# Patient Record
Sex: Female | Born: 1985 | Race: White | Hispanic: No | Marital: Married | Smoking: Never smoker
Health system: Southern US, Community
[De-identification: ages and names within clinical notes are randomized; demographics above are authoritative.]

## PROBLEM LIST (undated history)

## (undated) ENCOUNTER — Inpatient Hospital Stay (HOSPITAL_COMMUNITY): Payer: Self-pay

## (undated) DIAGNOSIS — J45909 Unspecified asthma, uncomplicated: Secondary | ICD-10-CM

## (undated) DIAGNOSIS — K649 Unspecified hemorrhoids: Secondary | ICD-10-CM

## (undated) DIAGNOSIS — F411 Generalized anxiety disorder: Secondary | ICD-10-CM

## (undated) HISTORY — DX: Generalized anxiety disorder: F41.1

## (undated) HISTORY — PX: ADENOIDECTOMY: SUR15

## (undated) HISTORY — PX: TONSILLECTOMY: SUR1361

## (undated) HISTORY — PX: TYMPANOSTOMY TUBE PLACEMENT: SHX32

## (undated) HISTORY — PX: WISDOM TOOTH EXTRACTION: SHX21

## (undated) HISTORY — DX: Unspecified hemorrhoids: K64.9

---

## 2017-06-09 ENCOUNTER — Encounter: Payer: Self-pay | Admitting: Osteopathic Medicine

## 2017-06-09 ENCOUNTER — Ambulatory Visit (INDEPENDENT_AMBULATORY_CARE_PROVIDER_SITE_OTHER): Payer: Managed Care, Other (non HMO) | Admitting: Osteopathic Medicine

## 2017-06-09 ENCOUNTER — Encounter (INDEPENDENT_AMBULATORY_CARE_PROVIDER_SITE_OTHER): Payer: Self-pay

## 2017-06-09 VITALS — BP 106/71 | HR 79 | Temp 98.6°F | Ht 70.0 in | Wt 160.0 lb

## 2017-06-09 DIAGNOSIS — Z3041 Encounter for surveillance of contraceptive pills: Secondary | ICD-10-CM | POA: Diagnosis not present

## 2017-06-09 DIAGNOSIS — F411 Generalized anxiety disorder: Secondary | ICD-10-CM | POA: Diagnosis not present

## 2017-06-09 DIAGNOSIS — K649 Unspecified hemorrhoids: Secondary | ICD-10-CM | POA: Diagnosis not present

## 2017-06-09 DIAGNOSIS — Z8342 Family history of familial hypercholesterolemia: Secondary | ICD-10-CM | POA: Diagnosis not present

## 2017-06-09 DIAGNOSIS — Z23 Encounter for immunization: Secondary | ICD-10-CM

## 2017-06-09 DIAGNOSIS — H1032 Unspecified acute conjunctivitis, left eye: Secondary | ICD-10-CM | POA: Diagnosis not present

## 2017-06-09 HISTORY — DX: Unspecified hemorrhoids: K64.9

## 2017-06-09 HISTORY — DX: Generalized anxiety disorder: F41.1

## 2017-06-09 MED ORDER — HYDROCORTISONE 2.5 % RE CREA: 1.0000 "application " | TOPICAL_CREAM | Freq: Two times a day (BID) | RECTAL | 2 refills | Status: DC

## 2017-06-09 MED ORDER — SERTRALINE HCL 100 MG PO TABS: 100.0000 mg | ORAL_TABLET | Freq: Every day | ORAL | 3 refills | Status: AC

## 2017-06-09 MED ORDER — ERYTHROMYCIN 5 MG/GM OP OINT: TOPICAL_OINTMENT | OPHTHALMIC | 0 refills | Status: DC

## 2017-06-09 MED ORDER — BENZOCAINE 20 % RE OINT: TOPICAL_OINTMENT | RECTAL | 2 refills | Status: DC | PRN

## 2017-06-09 NOTE — Progress Notes (Signed)
HPI: Brenda Banks is a 32 y.o. female who  has no past medical history on file.  she [facility] to [facility]  today, 06/09/17,  for chief complaint of: New to Establish Eye Redness and Itching   Eye problem: . Context: no injury . Location: L eye conjunctiva, lids ok . Quality: itching, some drainage, no pain . Duration: 1 day . Timing: constant  Hx hemorrhoids: requests cream she was on before, can't recall name. Occasional pain with BM and some bleeding, not painful right now.   Available records reviewed, ER visit 03/24/17 - seen for anxiety attack, Rx Hydroxyzine  Hx Anxiety: currently on Zoloft 50 mg daily, would like to increase to 100. Previously Rx by her OBGYN. Doing okay on this medicine but feels like could be a bit better.   Contraception: OCP, thinking of another pregnancy in the future but not sure, happy with current method     Past medical, surgical, social and family history reviewed:  There are no active problems to display for this patient.  History reviewed. No pertinent surgical history.  Social History   Tobacco Use  . Smoking status: Not on file  Substance Use Topics  . Alcohol use: Not on file    Family History  Problem Relation Age of Onset  . Alcoholism Mother   . High Cholesterol Mother   . Alcoholism Maternal Grandmother   . High Cholesterol Maternal Grandmother   . Heart attack Maternal Grandfather   . High Cholesterol Maternal Grandfather   . Breast cancer Maternal Aunt   . High Cholesterol Maternal Aunt      Current medication list and allergy/intolerance information reviewed:    Current Outpatient Medications  Medication Sig Dispense Refill  .   28 g 2  .   3.5 g 0  .   28.35 g 2  . norethindrone (MICRONOR,CAMILA,ERRIN) 0.35 MG tablet     . sertraline (ZOLOFT) 50 MG tablet      No current facility-administered medications for this visit.     Allergies  Allergen Reactions  . Cefdinir      As per pt  . Sulfa Antibiotics       Review of Systems:  Constitutional:  No  fever, no chills, No recent illness, No unintentional weight changes. No significant fatigue.   HEENT: No  headache, no vision change, no hearing change, No sore throat, No  sinus pressure  Cardiac: No  chest pain, No  pressure, No palpitations, No  Orthopnea  Respiratory:  No  shortness of breath. No  Cough  Gastrointestinal: No  abdominal pain, No  nausea, No  vomiting,  No  blood in stool, No  diarrhea, No  constipation   Musculoskeletal: No new myalgia/arthralgia  Skin: No  Rash, No other wounds/concerning lesions  Genitourinary: No  incontinence, No  abnormal genital bleeding, No abnormal genital discharge  Hem/Onc: No  easy bruising/bleeding, No  abnormal lymph node  Endocrine: No cold intolerance,  No heat intolerance. No polyuria/polydipsia/polyphagia   Neurologic: No  weakness, No  dizziness, No  slurred speech/focal weakness/facial droop  Psychiatric: No  concerns with depression, +concerns with anxiety, No sleep problems, No mood problems  Exam:  BP 106/71   Pulse 79   Temp 98.6 F (37 C) (Oral)   Ht 5\' 10"  (1.778 m)   Wt 160 lb (72.6 kg)   BMI 22.96 kg/m    Constitutional: VS see above. General Appearance: alert, well-developed, well-nourished, NAD  Eyes:  Normal lids, non-icteric sclera, (+)injected conjunctiva on L, no drainage, normal eyelashes, EOMI, PERRL  Ears, Nose, Mouth, Throat: MMM, Normal external inspection ears/nares/mouth/lips/gums.  Neck: No masses, trachea midline. No thyroid enlargement. No tenderness/mass appreciated. No lymphadenopathy  Respiratory: Normal respiratory effort. no wheeze, no rhonchi, no rales  Cardiovascular: S1/S2 normal, no murmur, no rub/gallop auscultated. RRR.   Musculoskeletal: Gait normal.  Neurological: Normal balance/coordination. No tremor.   Skin: warm, dry, intact. No rash/ulcer. No concerning nevi or subq nodules on  limited exam.    Psychiatric: Normal judgment/insight. Normal mood and affect. Oriented x3.      ASSESSMENT/PLAN:   Acute conjunctivitis of left eye, unspecified acute conjunctivitis type - Plan: erythromycin ophthalmic ointment  Need for influenza vaccination - Plan: Flu Vaccine QUAD 6+ mos PF IM (Fluarix Quad PF)  Hemorrhoids, unspecified hemorrhoid type - Plan: benzocaine (AMERICAINE) 20 % rectal ointment, hydrocortisone (ANUSOL-HC) 2.5 % rectal cream  Family history of high cholesterol  Generalized anxiety disorder - Plan: sertraline (ZOLOFT) 100 MG tablet, DISCONTINUED: sertraline (ZOLOFT) 50 MG tablet  Encounter for surveillance of contraceptive pills - Plan: norethindrone (MICRONOR,CAMILA,ERRIN) 0.35 MG tablet      Visit summary with medication list and pertinent instructions was printed for patient to review. All questions at time of visit were answered - patient instructed to contact office with any additional concerns. ER/RTC precautions were reviewed with the patient.   Follow-up plan: Return for [facility], sooner if needed .   Please note: voice recognition software was used to produce this document, and typos may escape review. Please contact Dr. Lyn Hollingshead for any needed clarifications.

## 2017-06-18 ENCOUNTER — Telehealth: Payer: Self-pay | Admitting: Osteopathic Medicine

## 2017-06-18 MED ORDER — OSELTAMIVIR PHOSPHATE 75 MG PO CAPS: 75.0000 mg | ORAL_CAPSULE | Freq: Every day | ORAL | 0 refills | Status: DC

## 2017-06-18 NOTE — Telephone Encounter (Signed)
Husband tested positive for flu in office today, I'm sending her a prophylactic dose of Tamiflu

## 2017-08-10 LAB — HM PAP SMEAR: HM Pap smear: UNDETERMINED

## 2017-12-30 ENCOUNTER — Inpatient Hospital Stay (HOSPITAL_COMMUNITY)
Admission: AD | Admit: 2017-12-30 | Discharge: 2017-12-30 | Disposition: A | Discharge status: Home / Self Care | Payer: Managed Care, Other (non HMO) | Source: Ambulatory Visit | Attending: Obstetrics & Gynecology | Admitting: Obstetrics & Gynecology

## 2017-12-30 ENCOUNTER — Encounter (HOSPITAL_COMMUNITY): Payer: Self-pay | Admitting: *Deleted

## 2017-12-30 ENCOUNTER — Inpatient Hospital Stay (HOSPITAL_COMMUNITY): Payer: Managed Care, Other (non HMO)

## 2017-12-30 DIAGNOSIS — O039 Complete or unspecified spontaneous abortion without complication: Secondary | ICD-10-CM | POA: Insufficient documentation

## 2017-12-30 DIAGNOSIS — O209 Hemorrhage in early pregnancy, unspecified: Secondary | ICD-10-CM

## 2017-12-30 LAB — URINALYSIS, ROUTINE W REFLEX MICROSCOPIC
Bilirubin Urine: NEGATIVE
Glucose, UA: NEGATIVE mg/dL
Ketones, ur: NEGATIVE mg/dL
Nitrite: NEGATIVE
PROTEIN: 30 mg/dL — AB
Specific Gravity, Urine: 1.011 (ref 1.005–1.030)
pH: 6 (ref 5.0–8.0)

## 2017-12-30 LAB — HCG, QUANTITATIVE, PREGNANCY: hCG, Beta Chain, Quant, S: 80 m[IU]/mL — ABNORMAL HIGH (ref <5)

## 2017-12-30 LAB — CBC
HEMATOCRIT: 37 % (ref 36.0–46.0)
HEMOGLOBIN: 12.3 g/dL (ref 12.0–15.0)
MCH: 28.3 pg (ref 26.0–34.0)
MCHC: 33.2 g/dL (ref 30.0–36.0)
MCV: 85.1 fL (ref 78.0–100.0)
Platelets: 240 10*3/uL (ref 150–400)
RBC: 4.35 MIL/uL (ref 3.87–5.11)
RDW: 12.1 % (ref 11.5–15.5)
WBC: 7.7 10*3/uL (ref 4.0–10.5)

## 2017-12-30 LAB — WET PREP, GENITAL
CLUE CELLS WET PREP: NONE SEEN
SPERM: NONE SEEN
TRICH WET PREP: NONE SEEN
Yeast Wet Prep HPF POC: NONE SEEN

## 2017-12-30 LAB — ABO/RH: ABO/RH(D): B POS

## 2017-12-30 LAB — POCT PREGNANCY, URINE: Preg Test, Ur: POSITIVE — AB

## 2017-12-30 MED ORDER — ACETAMINOPHEN 500 MG PO TABS
1000.0000 mg | ORAL_TABLET | Freq: Once | ORAL | Status: AC
  Administered 2017-12-30: 1000 mg via ORAL
  Filled 2017-12-30: qty 2

## 2017-12-30 NOTE — MAU Provider Note (Signed)
Chief Complaint: Miscarriage   First Provider Initiated Contact with Patient 12/30/17 [phone]     SUBJECTIVE HPI: Brenda Banks is a 32 y.o. G3P1011 at [redacted]w[redacted]d who presents to [facility] reporting vaginal bleeding. Had positive HPT last week. Has appt with Dr. Charlotta Newton next Wednesday. Bleeding started last night as spotting. Increased this morning. Not saturating pads but has seen some stringy blood & small clots. Describes blood as purple. Denies abdominal pain, n/v/d, constipation, or recent intercourse.    Past Medical History:  Diagnosis Date  . Generalized anxiety disorder 06/09/2017  . Hemorrhoids 06/09/2017   OB History  Gravida Para Term Preterm AB Living  3 1 1  0 1 1  SAB TAB Ectopic Multiple Live Births  1 0 0 0 1    # Outcome Date GA Lbr Len/2nd Weight Sex Delivery Anes PTL Lv  3 Current           2 SAB           1 Term            Past Surgical History:  Procedure Laterality Date  . ADENOIDECTOMY    . TONSILLECTOMY    . TYMPANOSTOMY TUBE PLACEMENT     Social History   Socioeconomic History  . Marital status: Married    Spouse name: Hilda Rynders  . Number of children: 1  . Years of education: Not on file  . Highest education level: Some college, no degree  Occupational History  . Occupation: Psychologist, educational Asst     Employer: [employer]  Social Needs  . Financial resource strain: Not on file  . Food insecurity:    Worry: Not on file    Inability: Not on file  . Transportation needs:    Medical: Not on file    Non-medical: Not on file  Tobacco Use  . Smoking status: Never Smoker  . Smokeless tobacco: Never Used  Substance and Sexual Activity  . Alcohol use: Not Currently    Comment: not in pregnancy  . Drug use: No  . Sexual activity: Yes    Partners: Male    Birth control/protection: None  Lifestyle  . Physical activity:    Days per week: 0 days    Minutes per session: Not on file  . Stress: Not on file  Relationships  . Social connections:    Talks  on phone: Not on file    Gets together: Not on file    Attends religious service: Not on file    Active member of club or organization: Not on file    Attends meetings of clubs or organizations: Not on file    Relationship status: Not on file  . Intimate partner violence:    Fear of current or ex partner: Not on file    Emotionally abused: Not on file    Physically abused: Not on file    Forced sexual activity: Not on file  Other Topics Concern  . Not on file  Social History Narrative  . Not on file   Family History  Problem Relation Age of Onset  . Alcoholism Mother   . High Cholesterol Mother   . Alcoholism Maternal Grandmother   . High Cholesterol Maternal Grandmother   . Heart attack Maternal Grandfather   . High Cholesterol Maternal Grandfather   . Breast cancer Maternal Aunt   . High Cholesterol Maternal Aunt    No current facility-administered medications on file prior to encounter.    Current Outpatient Medications  on File Prior to Encounter  Medication Sig Dispense Refill  . benzocaine (AMERICAINE) 20 % rectal ointment Place rectally every 3 (three) hours as needed for pain (severe rectal pain). 28 g 2  . erythromycin ophthalmic ointment Apply 1/2 inch ribbon to affected eye QID for 5 days 3.5 g 0  . hydrocortisone (ANUSOL-HC) 2.5 % rectal cream Place 1 application rectally 2 (two) times daily. As needed for hemorrhoid pain, up to 1 week 28.35 g 2  . sertraline (ZOLOFT) 100 MG tablet Take 1 tablet (100 mg total) by mouth daily. 90 tablet 3   Allergies  Allergen Reactions  . Cefdinir     As per pt  . Sulfa Antibiotics     I have reviewed patient's Past Medical Hx, Surgical Hx, Family Hx, Social Hx, medications and allergies.   Review of Systems  Constitutional: Negative.   Gastrointestinal: Negative.   Genitourinary: Positive for vaginal bleeding.    OBJECTIVE Patient Vitals for the past 24 hrs:  BP Temp Temp src Pulse Resp Weight  12/30/17 1219 122/78 -  - 86 18 -  12/30/17 0932 127/82 98.3 F (36.8 C) Oral 75 20 78.2 kg   Constitutional: Well-developed, well-nourished female in no acute distress.  Cardiovascular: normal rate & rhythm, no murmur Respiratory: normal rate and effort. Lung sounds clear throughout GI: Abd soft, non-tender, Pos BS x 4. No guarding or rebound tenderness MS: Extremities nontender, no edema, normal ROM Neurologic: Alert and oriented x 4.  GU:     SPECULUM EXAM: NEFG, physiologic discharge, small amount of dark red mucoid  blood  BIMANUAL: No CMT. cervix closed; uterus normal size, no adnexal tenderness or  masses.    LAB RESULTS Results for orders placed or performed during the hospital encounter of 12/30/17 (from the past 24 hour(s))  Urinalysis, Routine w reflex microscopic     Status: Abnormal   Collection Time: 12/30/17  9:42 AM  Result Value Ref Range   Color, Urine YELLOW YELLOW   APPearance HAZY (A) CLEAR   Specific Gravity, Urine 1.011 1.005 - 1.030   pH 6.0 5.0 - 8.0   Glucose, UA NEGATIVE NEGATIVE mg/dL   Hgb urine dipstick LARGE (A) NEGATIVE   Bilirubin Urine NEGATIVE NEGATIVE   Ketones, ur NEGATIVE NEGATIVE mg/dL   Protein, ur 30 (A) NEGATIVE mg/dL   Nitrite NEGATIVE NEGATIVE   Leukocytes, UA SMALL (A) NEGATIVE   RBC / HPF >50 (H) 0 - 5 RBC/hpf   WBC, UA 21-50 0 - 5 WBC/hpf   Bacteria, UA RARE (A) NONE SEEN   Squamous Epithelial / LPF 0-5 0 - 5   Mucus PRESENT   Pregnancy, urine POC     Status: Abnormal   Collection Time: 12/30/17  9:45 AM  Result Value Ref Range   Preg Test, Ur POSITIVE (A) NEGATIVE  Wet prep, genital     Status: Abnormal   Collection Time: 12/30/17 10:24 AM  Result Value Ref Range   Yeast Wet Prep HPF POC NONE SEEN NONE SEEN   Trich, Wet Prep NONE SEEN NONE SEEN   Clue Cells Wet Prep HPF POC NONE SEEN NONE SEEN   WBC, Wet Prep HPF POC MODERATE (A) NONE SEEN   Sperm NONE SEEN   CBC     Status: None   Collection Time: 12/30/17 10:30 AM  Result Value Ref Range    WBC 7.7 4.0 - 10.5 K/uL   RBC 4.35 3.87 - 5.11 MIL/uL   Hemoglobin 12.3 12.0 - 15.0  g/dL   HCT 62.1 30.8 - 65.7 %   MCV 85.1 78.0 - 100.0 fL   MCH 28.3 26.0 - 34.0 pg   MCHC 33.2 30.0 - 36.0 g/dL   RDW 84.6 96.2 - 95.2 %   Platelets 240 150 - 400 K/uL  ABO/Rh     Status: None (Preliminary result)   Collection Time: 12/30/17 10:30 AM  Result Value Ref Range   ABO/RH(D)      B POS Performed at [facility], [address] [place], [place] [address]   hCG, quantitative, pregnancy     Status: Abnormal   Collection Time: 12/30/17 10:30 AM  Result Value Ref Range   hCG, Beta Chain, Quant, S 80 (H) <5 mIU/mL    IMAGING US Ob Less Than 14 Weeks With Ob Transvaginal  Result Date: 12/30/2017 CLINICAL DATA:  Vaginal bleeding EXAM: OBSTETRIC <14 WK [place] AND TRANSVAGINAL OB US TECHNIQUE: Both transabdominal and transvaginal ultrasound examinations were performed for complete evaluation of the gestation as well as the maternal uterus, adnexal regions, and pelvic cul-de-sac. Transvaginal technique was performed to assess early pregnancy. COMPARISON:  None. FINDINGS: Intrauterine gestational sac: None Yolk sac:  Not visualized Embryo:  Not visualized Cardiac Activity: Not visualized Heart Rate:   bpm MSD:   mm    w     d CRL:    mm    w    d                  [place] EDC: Subchorionic hemorrhage:  None visualized. Maternal uterus/adnexae: The uterus is retroverted. No adnexal mass. Trace free fluid. IMPRESSION: No intrauterine pregnancy visualized. Differential considerations would include early intrauterine pregnancy too early to visualize, spontaneous abortion, or occult ectopic pregnancy. Recommend close clinical followup and serial quantitative beta HCGs and ultrasounds. Electronically Signed   By: Charlett Nose M.D.   On: 12/30/2017 11:38    MAU COURSE Orders Placed This Encounter  Procedures  . Wet prep, genital  . US OB LESS THAN 14 WEEKS WITH OB TRANSVAGINAL  . Urinalysis, Routine w reflex  microscopic  . CBC  . hCG, quantitative, pregnancy  . Pregnancy, urine POC  . ABO/Rh  . Discharge patient   Meds ordered this encounter  Medications  . acetaminophen (TYLENOL) tablet 1,000 mg    MDM +UPT UA, wet prep, GC/chlamydia, CBC, ABO/Rh, quant hCG, and [place] today to rule out ectopic pregnancy B positive HCG 80 -- pt had positive HPT 9 days ago. Ultrasound shows no IUP or adnexal mass. Discussed results with Dr. Charlotta Newton. Appears like miscarriage d/t bleeding & low HCG. Pt to f/u in office in 2 weeks or sooner if she feels like she needs to be seen.    ASSESSMENT 1. Miscarriage   2. Vaginal bleeding in pregnancy, first trimester     PLAN Discharge home in stable condition. Bleeding precautions Work note given  Follow-up Information    Myna Hidalgo, DO Follow up.   Specialty:  Obstetrics and Gynecology Why:  Reschedule appointment for follow up in 2 weeks or sooner if needed Contact information: [address] [place] [address] [phone]          Allergies as of 12/30/2017      Reactions   Cefdinir    As per pt   Sulfa Antibiotics       Medication List    STOP taking these medications   norethindrone 0.35 MG tablet Commonly known as:  MICRONOR,CAMILA,ERRIN  oseltamivir 75 MG capsule Commonly known as:  TAMIFLU     TAKE these medications   benzocaine 20 % rectal ointment Commonly known as:  AMERICAINE Place rectally every 3 (three) hours as needed for pain (severe rectal pain).   erythromycin ophthalmic ointment Apply 1/2 inch ribbon to affected eye QID for 5 days   hydrocortisone 2.5 % rectal cream Commonly known as:  ANUSOL-HC Place 1 application rectally 2 (two) times daily. As needed for hemorrhoid pain, up to 1 week   sertraline 100 MG tablet Commonly known as:  ZOLOFT Take 1 tablet (100 mg total) by mouth daily.        Judeth HornLawrence, Amoreena Neubert, NP 12/30/2017  12:33 PM

## 2017-12-30 NOTE — Discharge Instructions (Signed)

## 2017-12-30 NOTE — MAU Note (Signed)
Patient reports she started having vaginal bleeding last night at 1800 and states it has gotten heavier this AM, passing several small clots.  Denies cramping pain, but states having a localized anterior headache above her right eye she rates a 6/10.  Hx of previous miscarriage.

## 2017-12-31 LAB — GC/CHLAMYDIA PROBE AMP (~~LOC~~) NOT AT ARMC
CHLAMYDIA, DNA PROBE: NEGATIVE
NEISSERIA GONORRHEA: NEGATIVE

## 2018-02-11 LAB — OB RESULTS CONSOLE ANTIBODY SCREEN: Antibody Screen: NEGATIVE

## 2018-02-11 LAB — OB RESULTS CONSOLE ABO/RH: RH Type: POSITIVE

## 2018-02-11 LAB — OB RESULTS CONSOLE RPR: RPR: NONREACTIVE

## 2018-02-11 LAB — OB RESULTS CONSOLE HIV ANTIBODY (ROUTINE TESTING): HIV: NONREACTIVE

## 2018-02-11 LAB — OB RESULTS CONSOLE RUBELLA ANTIBODY, IGM: Rubella: IMMUNE

## 2018-02-11 LAB — OB RESULTS CONSOLE GC/CHLAMYDIA
Chlamydia: NEGATIVE
Gonorrhea: NEGATIVE

## 2018-02-11 LAB — OB RESULTS CONSOLE HEPATITIS B SURFACE ANTIGEN: Hepatitis B Surface Ag: NEGATIVE

## 2018-02-20 ENCOUNTER — Emergency Department (INDEPENDENT_AMBULATORY_CARE_PROVIDER_SITE_OTHER)
Admission: EM | Admit: 2018-02-20 | Discharge: 2018-02-20 | Disposition: A | Discharge status: Home / Self Care | Payer: Managed Care, Other (non HMO) | Source: Home / Self Care | Attending: Emergency Medicine | Admitting: Emergency Medicine

## 2018-02-20 ENCOUNTER — Other Ambulatory Visit: Payer: Self-pay

## 2018-02-20 DIAGNOSIS — S92501A Displaced unspecified fracture of right lesser toe(s), initial encounter for closed fracture: Secondary | ICD-10-CM | POA: Diagnosis not present

## 2018-02-20 NOTE — ED Provider Notes (Signed)
[facility]    CSN: [id number] Arrival date & time: 02/20/18  1055     History   Chief Complaint Chief Complaint  Patient presents with  . Foot Injury    RT second toe    HPI Brenda Banks is a 32 y.o. female.   HPI Patient was at  Today and struck her right second toe on a piece of furniture. She is [redacted] weeks pregnant.toe is dislocated. Past Medical History:  Diagnosis Date  . Generalized anxiety disorder 06/09/2017  . Hemorrhoids 06/09/2017    Patient Active Problem List   Diagnosis Date Noted  . Generalized anxiety disorder 06/09/2017  . Hemorrhoids 06/09/2017  . Family history of high cholesterol 06/09/2017  . Acute conjunctivitis of left eye 06/09/2017    Past Surgical History:  Procedure Laterality Date  . ADENOIDECTOMY    . TONSILLECTOMY    . TYMPANOSTOMY TUBE PLACEMENT      OB History    Gravida  3   Para  1   Term  1   Preterm  0   AB  1   Living  1     SAB  1   TAB  0   Ectopic  0   Multiple  0   Live Births  1            Home Medications    Prior to Admission medications   Medication Sig Start Date End Date Taking? Authorizing Provider  benzocaine (AMERICAINE) 20 % rectal ointment Place rectally every 3 (three) hours as needed for pain (severe rectal pain). 06/09/17   Sunnie Nielsen, DO  erythromycin ophthalmic ointment Apply 1/2 inch ribbon to affected eye QID for 5 days 06/09/17   Sunnie Nielsen, DO  hydrocortisone (ANUSOL-HC) 2.5 % rectal cream Place 1 application rectally 2 (two) times daily. As needed for hemorrhoid pain, up to 1 week 06/09/17   Sunnie Nielsen, DO  sertraline (ZOLOFT) 100 MG tablet Take 1 tablet (100 mg total) by mouth daily. 06/09/17   Sunnie Nielsen, DO    Family History Family History  Problem Relation Age of Onset  . Alcoholism Mother   . High Cholesterol Mother   . Alcoholism Maternal Grandmother   . High Cholesterol Maternal Grandmother   . Heart attack Maternal  Grandfather   . High Cholesterol Maternal Grandfather   . Breast cancer Maternal Aunt   . High Cholesterol Maternal Aunt     Social History Social History   Tobacco Use  . Smoking status: Never Smoker  . Smokeless tobacco: Never Used  Substance Use Topics  . Alcohol use: Not Currently    Comment: not in pregnancy  . Drug use: No     Allergies   Cefdinir and Sulfa antibiotics   Review of Systems Review of Systems  Genitourinary:       Patient is [redacted] weeks pregnant     Physical Exam Triage Vital Signs ED Triage Vitals  Enc Vitals Group     BP 02/20/18 1220 123/72     Pulse Rate 02/20/18 1220 79     Resp --      Temp --      Temp src --      SpO2 02/20/18 1220 100 %     Weight --      Height --      Head Circumference --      Peak Flow --      Pain Score 02/20/18 1221 9  Pain Loc --      Pain Edu? --      Excl. in GC? --    No data found.  Updated Vital Signs BP 123/72 (BP Location: Right Arm)   Pulse 79   LMP 11/20/2017   SpO2 100%   Visual Acuity Right Eye Distance:   Left Eye Distance:   Bilateral Distance:    Right Eye Near:   Left Eye Near:    Bilateral Near:     Physical Exam  Musculoskeletal:  There is tenderness and swelling over th distal right great toe. The length of the toe is normal.she does have som flexion of the MTP, PIP, and DIP joints Capillary filling normal     UC Treatments / Results  Labs (all labs ordered are listed, but only abnormal results are displayed) Labs Reviewed - No data to display  EKG None  Radiology No results found.  Procedures Procedures (including critical care time)  Medications Ordered in UC Medications - No data to display  Initial Impression / Assessment and Plan / UC Course  I have reviewed the triage vital signs and the nursing notes.  Pertinent labs & imaging results that were available during my care of the patient were reviewed by me and considered in my medical decision making  (see chart for details).     No x-rays were done because the patient is [redacted] weeks pregnant. There were no evidence of a dislocation.I suspect she has a nndisplaced fracture. She will be treated with buddy taping rest and a postop shoe. Final Clinical Impressions(s) / UC Diagnoses   Final diagnoses:  Closed fracture of phalanx of right second toe, initial encounter     Discharge Instructions     Continue buddy taping. Wear a postop shoe    ED Prescriptions    None     Controlled Substance Prescriptions [place] Controlled Substance Registry consulted? Not Applicable   Collene Gobble, MD 02/20/18 [phone]

## 2018-02-20 NOTE — Discharge Instructions (Addendum)
Continue buddy taping. Wear a postop shoe

## 2018-02-20 NOTE — ED Triage Notes (Signed)
Pt stubbed toe at home. Painful to put pressure on it. Feels somewhat numb. Does not know if its broken or dislocated. Pain 9/10.

## 2018-05-12 NOTE — L&D Delivery Note (Signed)
Delivery Note At 2:22 PM a viable female was delivered via Vaginal, Spontaneous (Presentation: LOA).  APGAR: 6, 9; weight  pending Placenta status: L&D, .  Cord:  with the following complications: none.  Cord pH: n/a  Anesthesia:  epidural Episiotomy:  n/a Lacerations: 2nd degree Suture Repair: 2.0 3.0 vicryl Est. Blood Loss (mL): 242  Mom to postpartum.  Baby to Couplet care / Skin to Skin.  Sharon Seller 09/25/2018, 2:46 PM

## 2018-08-17 ENCOUNTER — Other Ambulatory Visit: Payer: Self-pay

## 2018-08-17 ENCOUNTER — Encounter (HOSPITAL_COMMUNITY): Payer: Self-pay | Admitting: *Deleted

## 2018-08-17 ENCOUNTER — Inpatient Hospital Stay (HOSPITAL_COMMUNITY)
Admission: AD | Admit: 2018-08-17 | Discharge: 2018-08-17 | Disposition: A | Discharge status: Home / Self Care | Payer: Managed Care, Other (non HMO) | Attending: Obstetrics & Gynecology | Admitting: Obstetrics & Gynecology

## 2018-08-17 DIAGNOSIS — O9989 Other specified diseases and conditions complicating pregnancy, childbirth and the puerperium: Secondary | ICD-10-CM | POA: Diagnosis not present

## 2018-08-17 DIAGNOSIS — O4703 False labor before 37 completed weeks of gestation, third trimester: Secondary | ICD-10-CM | POA: Insufficient documentation

## 2018-08-17 DIAGNOSIS — O26893 Other specified pregnancy related conditions, third trimester: Secondary | ICD-10-CM | POA: Insufficient documentation

## 2018-08-17 DIAGNOSIS — R197 Diarrhea, unspecified: Secondary | ICD-10-CM | POA: Insufficient documentation

## 2018-08-17 DIAGNOSIS — Z3A33 33 weeks gestation of pregnancy: Secondary | ICD-10-CM | POA: Diagnosis not present

## 2018-08-17 DIAGNOSIS — R1030 Lower abdominal pain, unspecified: Secondary | ICD-10-CM | POA: Insufficient documentation

## 2018-08-17 DIAGNOSIS — O99513 Diseases of the respiratory system complicating pregnancy, third trimester: Secondary | ICD-10-CM | POA: Diagnosis not present

## 2018-08-17 DIAGNOSIS — J45909 Unspecified asthma, uncomplicated: Secondary | ICD-10-CM | POA: Diagnosis not present

## 2018-08-17 DIAGNOSIS — Z79899 Other long term (current) drug therapy: Secondary | ICD-10-CM | POA: Diagnosis not present

## 2018-08-17 DIAGNOSIS — Z3689 Encounter for other specified antenatal screening: Secondary | ICD-10-CM | POA: Insufficient documentation

## 2018-08-17 DIAGNOSIS — M545 Low back pain: Secondary | ICD-10-CM | POA: Insufficient documentation

## 2018-08-17 HISTORY — DX: Unspecified asthma, uncomplicated: J45.909

## 2018-08-17 LAB — WET PREP, GENITAL
Clue Cells Wet Prep HPF POC: NONE SEEN
Sperm: NONE SEEN
Trich, Wet Prep: NONE SEEN
Yeast Wet Prep HPF POC: NONE SEEN

## 2018-08-17 LAB — URINALYSIS, ROUTINE W REFLEX MICROSCOPIC
Bilirubin Urine: NEGATIVE
Glucose, UA: NEGATIVE mg/dL
Hgb urine dipstick: NEGATIVE
Ketones, ur: NEGATIVE mg/dL
Nitrite: NEGATIVE
Protein, ur: NEGATIVE mg/dL
Specific Gravity, Urine: 1.004 — ABNORMAL LOW (ref 1.005–1.030)
pH: 7 (ref 5.0–8.0)

## 2018-08-17 LAB — FETAL FIBRONECTIN: Fetal Fibronectin: NEGATIVE

## 2018-08-17 MED ORDER — LACTATED RINGERS IV BOLUS: 1000.0000 mL | Freq: Once | INTRAVENOUS | Status: DC

## 2018-08-17 MED ORDER — LACTATED RINGERS BOLUS PEDS: 1000.0000 mL | Freq: Once | INTRAVENOUS | Status: DC

## 2018-08-17 MED ORDER — NIFEDIPINE 10 MG PO CAPS
10.0000 mg | ORAL_CAPSULE | ORAL | Status: DC | PRN
  Administered 2018-08-17 (×2): 10 mg via ORAL
  Filled 2018-08-17 (×2): qty 1

## 2018-08-17 NOTE — MAU Note (Signed)
Pt presents to MAU with complaints of lower abdominal cramping and lower back pain since last night. Denies any vaginal bleeding.

## 2018-08-17 NOTE — Discharge Instructions (Signed)
Braxton Hicks Contractions Contractions of the uterus can occur throughout pregnancy, but they are not always a sign that you are in labor. You may have practice contractions called Braxton Hicks contractions. These false labor contractions are sometimes confused with true labor. What are Braxton Hicks contractions? Braxton Hicks contractions are tightening movements that occur in the muscles of the uterus before labor. Unlike true labor contractions, these contractions do not result in opening (dilation) and thinning of the cervix. Toward the end of pregnancy (32-34 weeks), Braxton Hicks contractions can happen more often and may become stronger. These contractions are sometimes difficult to tell apart from true labor because they can be very uncomfortable. You should not feel embarrassed if you go to the hospital with false labor. Sometimes, the only way to tell if you are in true labor is for your health care provider to look for changes in the cervix. The health care provider will do a physical exam and may monitor your contractions. If you are not in true labor, the exam should show that your cervix is not dilating and your water has not broken. If there are no other health problems associated with your pregnancy, it is completely safe for you to be sent home with false labor. You may continue to have Braxton Hicks contractions until you go into true labor. How to tell the difference between true labor and false labor True labor  Contractions last 30-70 seconds.  Contractions become very regular.  Discomfort is usually felt in the top of the uterus, and it spreads to the lower abdomen and low back.  Contractions do not go away with walking.  Contractions usually become more intense and increase in frequency.  The cervix dilates and gets thinner. False labor  Contractions are usually shorter and not as strong as true labor contractions.  Contractions are usually irregular.  Contractions  are often felt in the front of the lower abdomen and in the groin.  Contractions may go away when you walk around or change positions while lying down.  Contractions get weaker and are shorter-lasting as time goes on.  The cervix usually does not dilate or become thin. Follow these instructions at home:   Take over-the-counter and prescription medicines only as told by your health care provider.  Keep up with your usual exercises and follow other instructions from your health care provider.  Eat and drink lightly if you think you are going into labor.  If Braxton Hicks contractions are making you uncomfortable: ? Change your position from lying down or resting to walking, or change from walking to resting. ? Sit and rest in a tub of warm water. ? Drink enough fluid to keep your urine pale yellow. Dehydration may cause these contractions. ? Do slow and deep breathing several times an hour.  Keep all follow-up prenatal visits as told by your health care provider. This is important. Contact a health care provider if:  You have a fever.  You have continuous pain in your abdomen. Get help right away if:  Your contractions become stronger, more regular, and closer together.  You have fluid leaking or gushing from your vagina.  You pass blood-tinged mucus (bloody show).  You have bleeding from your vagina.  You have low back pain that you never had before.  You feel your baby's head pushing down and causing pelvic pressure.  Your baby is not moving inside you as much as it used to. Summary  Contractions that occur before labor are   called Braxton Hicks contractions, false labor, or practice contractions.  Braxton Hicks contractions are usually shorter, weaker, farther apart, and less regular than true labor contractions. True labor contractions usually become progressively stronger and regular, and they become more frequent.  Manage discomfort from Braxton Hicks contractions  by changing position, resting in a warm bath, drinking plenty of water, or practicing deep breathing. This information is not intended to replace advice given to you by your health care provider. Make sure you discuss any questions you have with your health care provider. Document Released: 09/11/2016 Document Revised: 02/10/2017 Document Reviewed: 09/11/2016 Elsevier Interactive Patient Education  2019 Elsevier Inc.  

## 2018-08-17 NOTE — MAU Provider Note (Signed)
History     CSN: [id number]  Arrival date and time: 08/17/18 1313   None     Chief Complaint  Patient presents with  . Abdominal Pain   HPI   Brenda Banks is 33 y.o. [id number] female who presents for lower abdominal cramping, back pain, and diarrhea. Reports that the lower abdominal cramping started yesterday at 17:30 and feels similar to menstrual cramps and is located midline. Low back pain and diarrhea have been present for about 3-4 days. Has been trying to take warm baths for relief. Denies h/o preterm delivery. Needed to be induced with prior delivery. Reports she was closed in the office the last time she was checked about 1-2 weeks ago. Denies vaginal bleeding. Endorses significant amount of asymptomatic vaginal discharge.   OB History    Gravida  4   Para  1   Term  1   Preterm  0   AB  2   Living  1     SAB  2   TAB  0   Ectopic  0   Multiple  0   Live Births  1           Past Medical History:  Diagnosis Date  . Asthma   . Generalized anxiety disorder 06/09/2017  . Hemorrhoids 06/09/2017    Past Surgical History:  Procedure Laterality Date  . ADENOIDECTOMY    . TONSILLECTOMY    . TYMPANOSTOMY TUBE PLACEMENT      Family History  Problem Relation Age of Onset  . Alcoholism Mother   . High Cholesterol Mother   . Alcoholism Maternal Grandmother   . High Cholesterol Maternal Grandmother   . Heart attack Maternal Grandfather   . High Cholesterol Maternal Grandfather   . Breast cancer Maternal Aunt   . High Cholesterol Maternal Aunt     Social History   Tobacco Use  . Smoking status: Never Smoker  . Smokeless tobacco: Never Used  Substance Use Topics  . Alcohol use: Not Currently    Comment: not in pregnancy  . Drug use: No    Allergies:  Allergies  Allergen Reactions  . Cefdinir     As per pt  . Sulfa Antibiotics     Medications Prior to Admission  Medication Sig Dispense Refill Last Dose  . benzocaine (AMERICAINE) 20 %  rectal ointment Place rectally every 3 (three) hours as needed for pain (severe rectal pain). 28 g 2   . erythromycin ophthalmic ointment Apply 1/2 inch ribbon to affected eye QID for 5 days 3.5 g 0   . hydrocortisone (ANUSOL-HC) 2.5 % rectal cream Place 1 application rectally 2 (two) times daily. As needed for hemorrhoid pain, up to 1 week 28.35 g 2   . sertraline (ZOLOFT) 100 MG tablet Take 1 tablet (100 mg total) by mouth daily. 90 tablet 3     Review of Systems  Constitutional: Negative for chills and fever.  HENT: Negative for congestion and rhinorrhea.   Respiratory: Negative for cough and shortness of breath.   Cardiovascular: Negative for chest pain.  Gastrointestinal: Positive for abdominal pain and diarrhea. Negative for constipation, nausea and vomiting.  Genitourinary: Positive for vaginal discharge. Negative for dysuria, flank pain, hematuria, pelvic pain and vaginal bleeding.  Musculoskeletal: Positive for back pain.  Skin: Negative for rash.  Neurological: Negative for dizziness and headaches.   Physical Exam   Blood pressure 116/78, pulse (!) 119, temperature 98.5 F (36.9 C), resp. rate 16, height 5\' 6"  (  1.676 m), weight 89.8 kg, last menstrual period 12/26/2017, SpO2 100 %, unknown if currently breastfeeding.  Physical Exam  Constitutional: She is oriented to person, place, and time. She appears well-developed and well-nourished. No distress.  HENT:  Head: Normocephalic and atraumatic.  Eyes: Conjunctivae and EOM are normal.  Neck: Normal range of motion. Neck supple.  Cardiovascular: Normal rate, regular rhythm and normal heart sounds.  Respiratory: Effort normal and breath sounds normal. No respiratory distress.  GI: Soft. She exhibits no distension. There is no abdominal tenderness. There is no rebound and no guarding.  Gravid   Genitourinary:    Genitourinary Comments: No vaginal discharge noted on blind swab    Musculoskeletal: Normal range of motion.      Comments: No CVA tenderness   Neurological: She is alert and oriented to person, place, and time.  Skin: Skin is warm and dry. She is not diaphoretic.  Psychiatric: She has a normal mood and affect. Her behavior is normal.   Cervix:  0/thick/posterior   NST: 140 bpm, moderate variability, +acels, no decels   MAU Course  Procedures  MDM NST reactive. Initially with irritability. FFN, wet prep, GC/Chlamydia collected.   During observation, noted to have contractions on toco. Procardia 10 mg x2 given and 1L LR bolus given. Contractions stopped on toco and patient's symptoms resolved on repeat exam.   Assessment and Plan   1. [redacted] weeks gestation of pregnancy   2. NST (non-stress test) reactive   3. Preterm uterine contractions, antepartum, third trimester    Preterm uterine contractions without cervical change. Reassuring that FFN negative. Wet prep negative. Cultures pending. Contractions resolved with Procardia and IVF. Encouraged adequate hydration. Labor precautions reviewed.     De HollingsheadCatherine L Wallace 08/17/2018, 1:54 PM

## 2018-08-18 LAB — GC/CHLAMYDIA PROBE AMP (~~LOC~~) NOT AT ARMC
Chlamydia: NEGATIVE
Neisseria Gonorrhea: NEGATIVE

## 2018-08-31 ENCOUNTER — Encounter (HOSPITAL_COMMUNITY): Payer: Self-pay | Admitting: *Deleted

## 2018-08-31 ENCOUNTER — Telehealth (HOSPITAL_COMMUNITY): Payer: Self-pay | Admitting: *Deleted

## 2018-08-31 NOTE — Telephone Encounter (Signed)
Preadmission screen  

## 2018-09-07 LAB — OB RESULTS CONSOLE GBS: GBS: NEGATIVE

## 2018-09-08 ENCOUNTER — Inpatient Hospital Stay (HOSPITAL_COMMUNITY): Payer: Managed Care, Other (non HMO)

## 2018-09-13 ENCOUNTER — Inpatient Hospital Stay (HOSPITAL_COMMUNITY)
Admission: AD | Admit: 2018-09-13 | Discharge: 2018-09-13 | Disposition: A | Discharge status: Home / Self Care | Payer: Managed Care, Other (non HMO) | Attending: Obstetrics & Gynecology | Admitting: Obstetrics & Gynecology

## 2018-09-13 ENCOUNTER — Other Ambulatory Visit: Payer: Self-pay

## 2018-09-13 ENCOUNTER — Encounter (HOSPITAL_COMMUNITY): Payer: Self-pay

## 2018-09-13 ENCOUNTER — Telehealth (HOSPITAL_COMMUNITY): Payer: Self-pay | Admitting: *Deleted

## 2018-09-13 DIAGNOSIS — Z3A37 37 weeks gestation of pregnancy: Secondary | ICD-10-CM | POA: Insufficient documentation

## 2018-09-13 DIAGNOSIS — O471 False labor at or after 37 completed weeks of gestation: Secondary | ICD-10-CM | POA: Insufficient documentation

## 2018-09-13 DIAGNOSIS — O479 False labor, unspecified: Secondary | ICD-10-CM | POA: Diagnosis not present

## 2018-09-13 NOTE — MAU Provider Note (Signed)
None      S: Ms. Brenda Banks is a 33 y.o. [phone] at [redacted]w[redacted]d  who presents to MAU today complaining contractions That started today.. She denies vaginal bleeding. She denies LOF. She reports normal fetal movement.    O: BP 128/81   Pulse 92   Temp 98.7 F (37.1 C) (Oral)   Resp 18   Wt 93 kg   LMP 12/26/2017   SpO2 100%   BMI 33.10 kg/m  GENERAL: Well-developed, well-nourished female in no acute distress.  HEAD: Normocephalic, atraumatic.  CHEST: Normal effort of breathing, regular heart rate ABDOMEN: Soft, nontender, gravid  Cervical exam:  Dilation: 1.5 Effacement (%): 20 Cervical Position: Anterior Station: -3 Presentation: Vertex Exam by:: Ginnie Smart RN  Patient was offered pain medication and declined. She was kept for an additional hour and her cervix was unchanged.   Fetal Monitoring: Baseline: 150 bpm Variability: Moderate  Accelerations: 15x15 Decelerations: Done Contractions: Irregular pattern.    A: SIUP at [redacted]w[redacted]d  False labor  P: Discharge home in stable condition Return to MAU if symptoms worsen  Labor precautions Kick counts    Jahan Friedlander, Harolyn Rutherford, NP 09/13/2018 8:47 PM

## 2018-09-13 NOTE — MAU Note (Signed)
Pt having ctx for a day and now about 5 minutes apart. Rates pain 4-5/10. No LOF or bleeding.

## 2018-09-13 NOTE — Telephone Encounter (Signed)
Preadmission screen  

## 2018-09-13 NOTE — Discharge Instructions (Signed)
Braxton Hicks Contractions Contractions of the uterus can occur throughout pregnancy, but they are not always a sign that you are in labor. You may have practice contractions called Braxton Hicks contractions. These false labor contractions are sometimes confused with true labor. What are Braxton Hicks contractions? Braxton Hicks contractions are tightening movements that occur in the muscles of the uterus before labor. Unlike true labor contractions, these contractions do not result in opening (dilation) and thinning of the cervix. Toward the end of pregnancy (32-34 weeks), Braxton Hicks contractions can happen more often and may become stronger. These contractions are sometimes difficult to tell apart from true labor because they can be very uncomfortable. You should not feel embarrassed if you go to the hospital with false labor. Sometimes, the only way to tell if you are in true labor is for your health care provider to look for changes in the cervix. The health care provider will do a physical exam and may monitor your contractions. If you are not in true labor, the exam should show that your cervix is not dilating and your water has not broken. If there are no other health problems associated with your pregnancy, it is completely safe for you to be sent home with false labor. You may continue to have Braxton Hicks contractions until you go into true labor. How to tell the difference between true labor and false labor True labor  Contractions last 30-70 seconds.  Contractions become very regular.  Discomfort is usually felt in the top of the uterus, and it spreads to the lower abdomen and low back.  Contractions do not go away with walking.  Contractions usually become more intense and increase in frequency.  The cervix dilates and gets thinner. False labor  Contractions are usually shorter and not as strong as true labor contractions.  Contractions are usually irregular.  Contractions  are often felt in the front of the lower abdomen and in the groin.  Contractions may go away when you walk around or change positions while lying down.  Contractions get weaker and are shorter-lasting as time goes on.  The cervix usually does not dilate or become thin. Follow these instructions at home:   Take over-the-counter and prescription medicines only as told by your health care provider.  Keep up with your usual exercises and follow other instructions from your health care provider.  Eat and drink lightly if you think you are going into labor.  If Braxton Hicks contractions are making you uncomfortable: ? Change your position from lying down or resting to walking, or change from walking to resting. ? Sit and rest in a tub of warm water. ? Drink enough fluid to keep your urine pale yellow. Dehydration may cause these contractions. ? Do slow and deep breathing several times an hour.  Keep all follow-up prenatal visits as told by your health care provider. This is important. Contact a health care provider if:  You have a fever.  You have continuous pain in your abdomen. Get help right away if:  Your contractions become stronger, more regular, and closer together.  You have fluid leaking or gushing from your vagina.  You pass blood-tinged mucus (bloody show).  You have bleeding from your vagina.  You have low back pain that you never had before.  You feel your baby's head pushing down and causing pelvic pressure.  Your baby is not moving inside you as much as it used to. Summary  Contractions that occur before labor are   called Braxton Hicks contractions, false labor, or practice contractions.  Braxton Hicks contractions are usually shorter, weaker, farther apart, and less regular than true labor contractions. True labor contractions usually become progressively stronger and regular, and they become more frequent.  Manage discomfort from Braxton Hicks contractions  by changing position, resting in a warm bath, drinking plenty of water, or practicing deep breathing. This information is not intended to replace advice given to you by your health care provider. Make sure you discuss any questions you have with your health care provider. Document Released: 09/11/2016 Document Revised: 02/10/2017 Document Reviewed: 09/11/2016 Elsevier Interactive Patient Education  2019 Elsevier Inc.  

## 2018-09-16 ENCOUNTER — Other Ambulatory Visit: Payer: Self-pay

## 2018-09-16 ENCOUNTER — Inpatient Hospital Stay (HOSPITAL_COMMUNITY)
Admission: AD | Admit: 2018-09-16 | Discharge: 2018-09-16 | Disposition: A | Discharge status: Home / Self Care | Payer: Managed Care, Other (non HMO) | Attending: Obstetrics & Gynecology | Admitting: Obstetrics & Gynecology

## 2018-09-16 ENCOUNTER — Encounter (HOSPITAL_COMMUNITY): Payer: Self-pay | Admitting: *Deleted

## 2018-09-16 ENCOUNTER — Encounter (HOSPITAL_COMMUNITY): Payer: Self-pay

## 2018-09-16 ENCOUNTER — Telehealth (HOSPITAL_COMMUNITY): Payer: Self-pay | Admitting: *Deleted

## 2018-09-16 DIAGNOSIS — O4703 False labor before 37 completed weeks of gestation, third trimester: Secondary | ICD-10-CM

## 2018-09-16 DIAGNOSIS — Z3A37 37 weeks gestation of pregnancy: Secondary | ICD-10-CM | POA: Insufficient documentation

## 2018-09-16 DIAGNOSIS — O479 False labor, unspecified: Secondary | ICD-10-CM

## 2018-09-16 DIAGNOSIS — O471 False labor at or after 37 completed weeks of gestation: Secondary | ICD-10-CM | POA: Insufficient documentation

## 2018-09-16 NOTE — MAU Note (Signed)
Pt still having ctx 4-5 min apart. Saw some bloody show last night which is new.

## 2018-09-16 NOTE — Discharge Instructions (Signed)
Fetal Movement Counts °Patient Name: ________________________________________________ Patient Due Date: ____________________ °What is a fetal movement count? ° °A fetal movement count is the number of times that you feel your baby move during a certain amount of time. This may also be called a fetal kick count. A fetal movement count is recommended for every pregnant woman. You may be asked to start counting fetal movements as early as week 28 of your pregnancy. °Pay attention to when your baby is most active. You may notice your baby's sleep and wake cycles. You may also notice things that make your baby move more. You should do a fetal movement count: °· When your baby is normally most active. °· At the same time each day. °A good time to count movements is while you are resting, after having something to eat and drink. °How do I count fetal movements? °1. Find a quiet, comfortable area. Sit, or lie down on your side. °2. Write down the date, the start time and stop time, and the number of movements that you felt between those two times. Take this information with you to your health care visits. °3. For 2 hours, count kicks, flutters, swishes, rolls, and jabs. You should feel at least 10 movements during 2 hours. °4. You may stop counting after you have felt 10 movements. °5. If you do not feel 10 movements in 2 hours, have something to eat and drink. Then, keep resting and counting for 1 hour. If you feel at least 4 movements during that hour, you may stop counting. °Contact a health care provider if: °· You feel fewer than 4 movements in 2 hours. °· Your baby is not moving like he or she usually does. °Date: ____________ Start time: ____________ Stop time: ____________ Movements: ____________ °Date: ____________ Start time: ____________ Stop time: ____________ Movements: ____________ °Date: ____________ Start time: ____________ Stop time: ____________ Movements: ____________ °Date: ____________ Start time:  ____________ Stop time: ____________ Movements: ____________ °Date: ____________ Start time: ____________ Stop time: ____________ Movements: ____________ °Date: ____________ Start time: ____________ Stop time: ____________ Movements: ____________ °Date: ____________ Start time: ____________ Stop time: ____________ Movements: ____________ °Date: ____________ Start time: ____________ Stop time: ____________ Movements: ____________ °Date: ____________ Start time: ____________ Stop time: ____________ Movements: ____________ °This information is not intended to replace advice given to you by your health care provider. Make sure you discuss any questions you have with your health care provider. °Document Released: 05/28/2006 Document Revised: 12/26/2015 Document Reviewed: 06/07/2015 °Elsevier Interactive Patient Education © 2019 [employer] Inc[employer] °Signs and Symptoms of Labor °Labor is your body's natural process of moving your baby, placenta, and umbilical cord out of your uterus. The process of labor usually starts when your baby is full-term, between 37 and 40 weeks of pregnancy. °How will I know when I am close to going into labor? °As your body prepares for labor and the birth of your baby, you may notice the following symptoms in the weeks and days before true labor starts: °· Having a strong desire to get your home ready to receive your new baby. This is called nesting. Nesting may be a sign that labor is approaching, and it may occur several weeks before birth. Nesting may involve cleaning and organizing your home. °· Passing a small amount of thick, bloody mucus out of your vagina (normal bloody show or losing your mucus plug). This may happen more than a week before labor begins, or it might occur right before labor begins as the opening of the cervix   starts to widen (dilate). For some women, the entire mucus plug passes at once. For others, smaller portions of the mucus plug may gradually pass over several  days. °· Your baby moving (dropping) lower in your pelvis to get into position for birth (lightening). When this happens, you may feel more pressure on your bladder and pelvic bone and less pressure on your ribs. This may make it easier to breathe. It may also cause you to need to urinate more often and have problems with bowel movements. °· Having "practice contractions" (Braxton Hicks contractions) that occur at irregular (unevenly spaced) intervals that are more than 10 minutes apart. This is also called false labor. False labor contractions are common after exercise or sexual activity, and they will stop if you change position, rest, or drink fluids. These contractions are usually mild and do not get stronger over time. They may feel like: °? A backache or back pain. °? Mild cramps, similar to menstrual cramps. °? Tightening or pressure in your abdomen. °Other early symptoms that labor may be starting soon include: °· Nausea or loss of appetite. °· Diarrhea. °· Having a sudden burst of energy, or feeling very tired. °· Mood changes. °· Having trouble sleeping. °How will I know when labor has begun? °Signs that true labor has begun may include: °· Having contractions that come at regular (evenly spaced) intervals and increase in intensity. This may feel like more intense tightening or pressure in your abdomen that moves to your back. °? Contractions may also feel like rhythmic pain in your upper thighs or back that comes and goes at regular intervals. °? For first-time mothers, this change in intensity of contractions often occurs at a more gradual pace. °? Women who have given birth before may notice a more rapid progression of contraction changes. °· Having a feeling of pressure in the vaginal area. °· Your water breaking (rupture of membranes). This is when the sac of fluid that surrounds your baby breaks. When this happens, you will notice fluid leaking from your vagina. This may be clear or blood-tinged.  Labor usually starts within 24 hours of your water breaking, but it may take longer to begin. °? Some women notice this as a gush of fluid. °? Others notice that their underwear repeatedly becomes damp. °Follow these instructions at home: ° °· When labor starts, or if your water breaks, call your health care provider or nurse care line. Based on your situation, they will determine when you should go in for an exam. °· When you are in early labor, you may be able to rest and manage symptoms at home. Some strategies to try at home include: °? Breathing and relaxation techniques. °? Taking a warm bath or shower. °? Listening to music. °? Using a heating pad on the lower back for pain. If you are directed to use heat: °§ Place a towel between your skin and the heat source. °§ Leave the heat on for 20-30 minutes. °§ Remove the heat if your skin turns bright red. This is especially important if you are unable to feel pain, heat, or cold. You may have a greater risk of getting burned. °Get help right away if: °· You have painful, regular contractions that are 5 minutes apart or less. °· Labor starts before you are [redacted] weeks along in your pregnancy. °· You have a fever. °· You have a headache that does not go away. °· You have bright red blood coming from your vagina. °·   You do not feel your baby moving. °· You have a sudden onset of: °? Severe headache with vision problems. °? Nausea, vomiting, or diarrhea. °? Chest pain or shortness of breath. °These symptoms may be an emergency. If your health care provider recommends that you go to the hospital or birth center where you plan to deliver, do not drive yourself. Have someone else drive you, or call emergency services (911 in the [place] °Summary °· Labor is your body's natural process of moving your baby, placenta, and umbilical cord out of your uterus. °· The process of labor usually starts when your baby is full-term, between 37 and 40 weeks of pregnancy. °· When labor  starts, or if your water breaks, call your health care provider or nurse care line. Based on your situation, they will determine when you should go in for an exam. °This information is not intended to replace advice given to you by your health care provider. Make sure you discuss any questions you have with your health care provider. °Document Released: 10/03/2016 Document Revised: 10/03/2016 Document Reviewed: 10/03/2016 °Elsevier Interactive Patient Education © 2019 Elsevier Inc. ° °

## 2018-09-16 NOTE — MAU Provider Note (Signed)
S: Patient is here for RN labor evaluation. Strip, vital signs, & chart Reviewed   O:  Vitals:   09/16/18 1349 09/16/18 1350  BP:  123/80  Pulse:  100  Resp: 16   SpO2: 100% 100%  Weight: 92.9 kg    No results found for this or any previous visit (from the past 24 hour(s)).  Dilation: 1.5 Effacement (%): 50 Cervical Position: Anterior(Left) Station: -3 Presentation: Vertex Exam by:: ginger morris rn   FHR: 145 bpm, Mod Var, No Decels, 15x15 Accels UC: irregular   A: 1. False labor   2. [redacted] weeks gestation of pregnancy      P:  RN to discharge home in stable condition with return precautions & fetal kick counts  Judeth Horn FNP 3:12 PM

## 2018-09-16 NOTE — Telephone Encounter (Signed)
Preadmission screen  

## 2018-09-22 ENCOUNTER — Other Ambulatory Visit (HOSPITAL_COMMUNITY)
Admission: RE | Admit: 2018-09-22 | Discharge: 2018-09-22 | Disposition: A | Discharge status: Home / Self Care | Payer: Managed Care, Other (non HMO) | Source: Ambulatory Visit | Attending: Obstetrics & Gynecology | Admitting: Obstetrics & Gynecology

## 2018-09-23 ENCOUNTER — Other Ambulatory Visit: Payer: Self-pay | Admitting: Obstetrics & Gynecology

## 2018-09-24 ENCOUNTER — Other Ambulatory Visit (HOSPITAL_COMMUNITY): Payer: Self-pay | Admitting: *Deleted

## 2018-09-24 ENCOUNTER — Other Ambulatory Visit: Payer: Self-pay | Admitting: Obstetrics & Gynecology

## 2018-09-24 NOTE — H&P (Signed)
HPI: 33 y/o V6P0141 @ [redacted]w[redacted]d estimated gestational age (as dated by LMP c/w 20 week ultrasound) presents for elective IOL.   no Leaking of Fluid,   no Vaginal Bleeding,   irregular Uterine Contractions- pt has been seen in MAU due to this concern,  + Fetal Movement.  Prenatal care has been provided by Dr. Charlotta Newton  ROS: no HA, no epigastric pain, no visual changes.    Pregnancy complicated by: 1) anxiety- on zoloft 100mg  daily   Prenatal Transfer Tool  Maternal Diabetes: No Genetic Screening: Normal low risk FEMALE Maternal Ultrasounds/Referrals: Normal Fetal Ultrasounds or other Referrals:  None Maternal Substance Abuse:  No Significant Maternal Medications:  Meds include: Zoloft Significant Maternal Lab Results: Lab values include: Group B Strep negative   PNL:  GBS neg, Rub Immune, Hep B neg, RPR NR, HIV neg, GC/C neg, glucola:110 Hgb: 10.6 Blood type: B positive, antibody neg  Immunizations: Tdap: 07/29/2018 Flu: outside facility  OBHx: 2018- FTNSVD, 9#2oz, uncomplicated SAB x 2  Past Medical History:  Diagnosis Date  . Asthma   . Generalized anxiety disorder 06/09/2017  . Hemorrhoids 06/09/2017         Past Surgical History:  Procedure Laterality Date  . ADENOIDECTOMY    . TONSILLECTOMY    . TYMPANOSTOMY TUBE PLACEMENT           Family History  Problem Relation Age of Onset  . Alcoholism Mother   . High Cholesterol Mother   . Alcoholism Maternal Grandmother   . High Cholesterol Maternal Grandmother   . Heart attack Maternal Grandfather   . High Cholesterol Maternal Grandfather   . Breast cancer Maternal Aunt   . High Cholesterol Maternal Aunt     Social History        Tobacco Use  . Smoking status: Never Smoker  . Smokeless tobacco: Never Used  Substance Use Topics  . Alcohol use: Not Currently    Comment: not in pregnancy  . Drug use: No    Allergies:       Allergies  Allergen Reactions  . Cefdinir     As per pt  .  Sulfa Antibiotics    SocHx:   no Tobacco, no  EtOH, no Illicit Drugs  O: LMP 12/26/2017  Gen. AAOx3, NAD CV.  RRR  No murmur.  Resp. CTAB, no wheeze or crackles. Abd. Gravid,  no tenderness,  no rigidity,  no guarding Extr.  1+ non-pitting edema B/L , no calf tenderness   FHT: 140 by doppler in office  Labs: see orders  A/P:  33 y.o. [id number] @ [redacted]w[redacted]d EGA who presents for elective IOL -FWB:  Reassuring by doppler -Labor: start induction with cytotec per protocol -GBS: negative -Pain management: IV or epidural upon request -Anxiety: continue zoloft 100mg  daily  Myna Hidalgo, DO [phone] (cell) [phone] (office)

## 2018-09-25 ENCOUNTER — Inpatient Hospital Stay (HOSPITAL_COMMUNITY)
Admission: AD | Admit: 2018-09-25 | Discharge: 2018-09-26 | DRG: 807 | Disposition: A | Discharge status: Home / Self Care | Payer: Managed Care, Other (non HMO) | Attending: Obstetrics & Gynecology | Admitting: Obstetrics & Gynecology

## 2018-09-25 ENCOUNTER — Encounter (HOSPITAL_COMMUNITY): Payer: Self-pay | Admitting: *Deleted

## 2018-09-25 ENCOUNTER — Inpatient Hospital Stay (HOSPITAL_COMMUNITY): Payer: Managed Care, Other (non HMO) | Admitting: Anesthesiology

## 2018-09-25 ENCOUNTER — Inpatient Hospital Stay (HOSPITAL_COMMUNITY): Payer: Managed Care, Other (non HMO)

## 2018-09-25 ENCOUNTER — Other Ambulatory Visit: Payer: Self-pay

## 2018-09-25 DIAGNOSIS — F411 Generalized anxiety disorder: Secondary | ICD-10-CM | POA: Diagnosis present

## 2018-09-25 DIAGNOSIS — Z3A39 39 weeks gestation of pregnancy: Secondary | ICD-10-CM | POA: Diagnosis not present

## 2018-09-25 DIAGNOSIS — O99344 Other mental disorders complicating childbirth: Secondary | ICD-10-CM | POA: Diagnosis present

## 2018-09-25 DIAGNOSIS — Z1159 Encounter for screening for other viral diseases: Secondary | ICD-10-CM | POA: Diagnosis not present

## 2018-09-25 DIAGNOSIS — O26893 Other specified pregnancy related conditions, third trimester: Secondary | ICD-10-CM | POA: Diagnosis present

## 2018-09-25 LAB — ABO/RH: ABO/RH(D): B POS

## 2018-09-25 LAB — TYPE AND SCREEN
ABO/RH(D): B POS
Antibody Screen: NEGATIVE

## 2018-09-25 LAB — CBC
HCT: 32.1 % — ABNORMAL LOW (ref 36.0–46.0)
Hemoglobin: 10.5 g/dL — ABNORMAL LOW (ref 12.0–15.0)
MCH: 28.7 pg (ref 26.0–34.0)
MCHC: 32.7 g/dL (ref 30.0–36.0)
MCV: 87.7 fL (ref 80.0–100.0)
Platelets: 215 10*3/uL (ref 150–400)
RBC: 3.66 MIL/uL — ABNORMAL LOW (ref 3.87–5.11)
RDW: 13.7 % (ref 11.5–15.5)
WBC: 10.3 10*3/uL (ref 4.0–10.5)
nRBC: 0 % (ref 0.0–0.2)

## 2018-09-25 LAB — RPR: RPR Ser Ql: NONREACTIVE

## 2018-09-25 LAB — SARS CORONAVIRUS 2 BY RT PCR (HOSPITAL ORDER, PERFORMED IN ~~LOC~~ HOSPITAL LAB): SARS Coronavirus 2: NEGATIVE

## 2018-09-25 MED ORDER — DIPHENHYDRAMINE HCL 50 MG/ML IJ SOLN: 12.5000 mg | INTRAMUSCULAR | Status: DC | PRN

## 2018-09-25 MED ORDER — SIMETHICONE 80 MG PO CHEW: 80.0000 mg | CHEWABLE_TABLET | ORAL | Status: DC | PRN

## 2018-09-25 MED ORDER — ACETAMINOPHEN 325 MG PO TABS: 650.0000 mg | ORAL_TABLET | ORAL | Status: DC | PRN

## 2018-09-25 MED ORDER — SENNOSIDES-DOCUSATE SODIUM 8.6-50 MG PO TABS
2.0000 | ORAL_TABLET | ORAL | Status: DC
  Administered 2018-09-25: 2 via ORAL
  Filled 2018-09-25: qty 2

## 2018-09-25 MED ORDER — EPHEDRINE 5 MG/ML INJ
10.0000 mg | INTRAVENOUS | Status: DC | PRN
  Filled 2018-09-25: qty 10

## 2018-09-25 MED ORDER — DIBUCAINE (PERIANAL) 1 % EX OINT: 1.0000 "application " | TOPICAL_OINTMENT | CUTANEOUS | Status: DC | PRN

## 2018-09-25 MED ORDER — OXYTOCIN 40 UNITS IN NORMAL SALINE INFUSION - SIMPLE MED
2.5000 [IU]/h | INTRAVENOUS | Status: DC
  Administered 2018-09-25: 2.5 [IU]/h via INTRAVENOUS
  Filled 2018-09-25: qty 1000

## 2018-09-25 MED ORDER — SERTRALINE HCL 100 MG PO TABS
100.0000 mg | ORAL_TABLET | Freq: Every day | ORAL | Status: DC
  Administered 2018-09-25: 100 mg via ORAL
  Filled 2018-09-25: qty 1

## 2018-09-25 MED ORDER — DIPHENHYDRAMINE HCL 25 MG PO CAPS: 25.0000 mg | ORAL_CAPSULE | Freq: Four times a day (QID) | ORAL | Status: DC | PRN

## 2018-09-25 MED ORDER — SOD CITRATE-CITRIC ACID 500-334 MG/5ML PO SOLN: 30.0000 mL | ORAL | Status: DC | PRN

## 2018-09-25 MED ORDER — ERYTHROMYCIN 5 MG/GM OP OINT
TOPICAL_OINTMENT | OPHTHALMIC | Status: AC
  Filled 2018-09-25: qty 1

## 2018-09-25 MED ORDER — HYDROXYZINE HCL 50 MG PO TABS
50.0000 mg | ORAL_TABLET | Freq: Four times a day (QID) | ORAL | Status: DC | PRN
  Administered 2018-09-25: 50 mg via ORAL
  Filled 2018-09-25: qty 1

## 2018-09-25 MED ORDER — ONDANSETRON HCL 4 MG PO TABS: 4.0000 mg | ORAL_TABLET | ORAL | Status: DC | PRN

## 2018-09-25 MED ORDER — SERTRALINE HCL 100 MG PO TABS
100.0000 mg | ORAL_TABLET | Freq: Every day | ORAL | Status: DC
  Administered 2018-09-26: 11:00:00 100 mg via ORAL
  Filled 2018-09-25: qty 1

## 2018-09-25 MED ORDER — TERBUTALINE SULFATE 1 MG/ML IJ SOLN: 0.2500 mg | Freq: Once | INTRAMUSCULAR | Status: DC | PRN

## 2018-09-25 MED ORDER — OXYCODONE-ACETAMINOPHEN 5-325 MG PO TABS: 2.0000 | ORAL_TABLET | ORAL | Status: DC | PRN

## 2018-09-25 MED ORDER — IBUPROFEN 600 MG PO TABS
600.0000 mg | ORAL_TABLET | Freq: Four times a day (QID) | ORAL | Status: DC
  Administered 2018-09-25 – 2018-09-26 (×4): 600 mg via ORAL
  Filled 2018-09-25 (×4): qty 1

## 2018-09-25 MED ORDER — LACTATED RINGERS IV SOLN
500.0000 mL | Freq: Once | INTRAVENOUS | Status: AC
  Administered 2018-09-25: 500 mL via INTRAVENOUS

## 2018-09-25 MED ORDER — LIDOCAINE HCL (PF) 1 % IJ SOLN
30.0000 mL | INTRAMUSCULAR | Status: DC | PRN
  Filled 2018-09-25: qty 30

## 2018-09-25 MED ORDER — BENZOCAINE-MENTHOL 20-0.5 % EX AERO
1.0000 "application " | INHALATION_SPRAY | CUTANEOUS | Status: DC | PRN
  Administered 2018-09-25: 1 via TOPICAL
  Filled 2018-09-25: qty 56

## 2018-09-25 MED ORDER — MISOPROSTOL 25 MCG QUARTER TABLET
25.0000 ug | ORAL_TABLET | ORAL | Status: DC | PRN
  Administered 2018-09-25 (×2): 25 ug via VAGINAL
  Filled 2018-09-25 (×2): qty 1

## 2018-09-25 MED ORDER — PHENYLEPHRINE 40 MCG/ML (10ML) SYRINGE FOR IV PUSH (FOR BLOOD PRESSURE SUPPORT)
80.0000 ug | PREFILLED_SYRINGE | INTRAVENOUS | Status: DC | PRN
  Filled 2018-09-25: qty 10

## 2018-09-25 MED ORDER — PHENYLEPHRINE 40 MCG/ML (10ML) SYRINGE FOR IV PUSH (FOR BLOOD PRESSURE SUPPORT)
80.0000 ug | PREFILLED_SYRINGE | INTRAVENOUS | Status: AC | PRN
  Administered 2018-09-25 (×3): 80 ug via INTRAVENOUS

## 2018-09-25 MED ORDER — OXYTOCIN BOLUS FROM INFUSION
500.0000 mL | Freq: Once | INTRAVENOUS | Status: DC
  Administered 2018-09-25: 14:00:00 500 mL via INTRAVENOUS

## 2018-09-25 MED ORDER — ONDANSETRON HCL 4 MG/2ML IJ SOLN: 4.0000 mg | INTRAMUSCULAR | Status: DC | PRN

## 2018-09-25 MED ORDER — EPHEDRINE 5 MG/ML INJ
10.0000 mg | INTRAVENOUS | Status: DC | PRN
  Administered 2018-09-25: 10 mg via INTRAVENOUS

## 2018-09-25 MED ORDER — OXYTOCIN 40 UNITS IN NORMAL SALINE INFUSION - SIMPLE MED
1.0000 m[IU]/min | INTRAVENOUS | Status: DC
  Administered 2018-09-25: 2 m[IU]/min via INTRAVENOUS

## 2018-09-25 MED ORDER — LACTATED RINGERS IV SOLN: 500.0000 mL | INTRAVENOUS | Status: DC | PRN

## 2018-09-25 MED ORDER — LACTATED RINGERS IV SOLN
INTRAVENOUS | Status: DC
  Administered 2018-09-25 (×3): via INTRAVENOUS

## 2018-09-25 MED ORDER — FENTANYL-BUPIVACAINE-NACL 0.5-0.125-0.9 MG/250ML-% EP SOLN
12.0000 mL/h | EPIDURAL | Status: DC | PRN
  Filled 2018-09-25: qty 250

## 2018-09-25 MED ORDER — PRENATAL MULTIVITAMIN CH
1.0000 | ORAL_TABLET | Freq: Every day | ORAL | Status: DC
  Administered 2018-09-25 – 2018-09-26 (×2): 1 via ORAL
  Filled 2018-09-25 (×2): qty 1

## 2018-09-25 MED ORDER — BUTORPHANOL TARTRATE 1 MG/ML IJ SOLN: 1.0000 mg | INTRAMUSCULAR | Status: DC | PRN

## 2018-09-25 MED ORDER — OXYCODONE-ACETAMINOPHEN 5-325 MG PO TABS: 1.0000 | ORAL_TABLET | ORAL | Status: DC | PRN

## 2018-09-25 MED ORDER — ONDANSETRON HCL 4 MG/2ML IJ SOLN: 4.0000 mg | Freq: Four times a day (QID) | INTRAMUSCULAR | Status: DC | PRN

## 2018-09-25 MED ORDER — FENTANYL-BUPIVACAINE-NACL 0.5-0.125-0.9 MG/250ML-% EP SOLN: 12.0000 mL/h | EPIDURAL | Status: DC | PRN

## 2018-09-25 MED ORDER — WITCH HAZEL-GLYCERIN EX PADS: 1.0000 "application " | MEDICATED_PAD | CUTANEOUS | Status: DC | PRN

## 2018-09-25 MED ORDER — LIDOCAINE HCL (PF) 1 % IJ SOLN
INTRAMUSCULAR | Status: DC | PRN
  Administered 2018-09-25: 6 mL via EPIDURAL

## 2018-09-25 MED ORDER — COCONUT OIL OIL: 1.0000 "application " | TOPICAL_OIL | Status: DC | PRN

## 2018-09-25 MED ORDER — SODIUM CHLORIDE (PF) 0.9 % IJ SOLN
INTRAMUSCULAR | Status: DC | PRN
  Administered 2018-09-25: 12 mL/h via EPIDURAL

## 2018-09-25 MED ORDER — ZOLPIDEM TARTRATE 5 MG PO TABS: 5.0000 mg | ORAL_TABLET | Freq: Every evening | ORAL | Status: DC | PRN

## 2018-09-25 NOTE — Anesthesia Preprocedure Evaluation (Signed)
Anesthesia Evaluation  Patient identified by MRN, date of birth, ID band Patient awake    Reviewed: Allergy & Precautions, H&P , NPO status , Patient's Chart, lab work & pertinent test results, reviewed documented beta blocker date and time   Airway Mallampati: II  TM Distance: >3 FB Neck ROM: full    Dental no notable dental hx.    Pulmonary neg pulmonary ROS,    Pulmonary exam normal breath sounds clear to auscultation       Cardiovascular negative cardio ROS Normal cardiovascular exam Rhythm:regular Rate:Normal     Neuro/Psych negative neurological ROS  negative psych ROS   GI/Hepatic negative GI ROS, Neg liver ROS,   Endo/Other  negative endocrine ROS  Renal/GU negative Renal ROS  negative genitourinary   Musculoskeletal   Abdominal   Peds  Hematology negative hematology ROS (+)   Anesthesia Other Findings   Reproductive/Obstetrics (+) Pregnancy                             Anesthesia Physical Anesthesia Plan  ASA: II  Anesthesia Plan: Epidural   Post-op Pain Management:    Induction:   PONV Risk Score and Plan:   Airway Management Planned:   Additional Equipment:   Intra-op Plan:   Post-operative Plan:   Informed Consent: I have reviewed the patients History and Physical, chart, labs and discussed the procedure including the risks, benefits and alternatives for the proposed anesthesia with the patient or authorized representative who has indicated his/her understanding and acceptance.     Dental Advisory Given  Plan Discussed with:   Anesthesia Plan Comments: (Labs checked- platelets confirmed with RN in room. Fetal heart tracing, per RN, reported to be stable enough for sitting procedure. Discussed epidural, and patient consents to the procedure:  included risk of possible headache,backache, failed block, allergic reaction, and nerve injury. This patient was asked  if she had any questions or concerns before the procedure started.)        Anesthesia Quick Evaluation  

## 2018-09-25 NOTE — Progress Notes (Signed)
OB PN:  S: Pt resting comfortably, no acute complaints  O: BP 128/85   Pulse 98   Temp 98 F (36.7 C) (Oral)   Resp 16   Ht 5\' 10"  (1.778 m)   Wt 94.4 kg   LMP 12/26/2017   BMI 29.87 kg/m   FHT: 140bpm, moderate variablity, + accels, no decels Toco: irregular SVE: deferred Confirmed vertex by ultrasound  A/P: 32 y.o. [id number] @ [redacted]w[redacted]d for elective IOL 1. FWB: Cat. I 2. Labor: s/p cytotec #2, plan to transition to Pit per protocol this am Pain: epidural upon request GBS: negative Anxiety: continue zoloft daily  Myna Hidalgo, DO [phone] (cell) [phone] (office)

## 2018-09-25 NOTE — Progress Notes (Signed)
Patient attempted to void, unable to at this time. Encouraged fluids po.

## 2018-09-25 NOTE — Anesthesia Procedure Notes (Signed)
Epidural Patient location during procedure: OB Start time: 09/25/2018 10:19 AM End time: 09/25/2018 10:25 AM  Staffing Anesthesiologist: Bethena Midget, MD  Preanesthetic Checklist Completed: patient identified, site marked, surgical consent, pre-op evaluation, timeout performed, IV checked, risks and benefits discussed and monitors and equipment checked  Epidural Patient position: sitting Prep: site prepped and draped and DuraPrep Patient monitoring: continuous pulse ox and blood pressure Approach: midline Location: L3-L4 Injection technique: LOR air  Needle:  Needle type: Tuohy  Needle gauge: 17 G Needle length: 9 cm and 9 Needle insertion depth: 6 cm Catheter type: closed end flexible Catheter size: 19 Gauge Catheter at skin depth: 11 cm Test dose: negative  Assessment Events: blood not aspirated, injection not painful, no injection resistance, negative IV test and no paresthesia

## 2018-09-25 NOTE — Lactation Note (Signed)
This note was copied from a baby's chart. Lactation Consultation Note  Patient Name: Brenda Banks [facility] Date: 09/25/2018 Reason for consult: Initial assessment;Term  6 hours old FT female who is being exclusively BF by his mother, she's a P2 and somehow experienced BF. She was able to BF her first child for 6 weeks, but started supplementing on the second week due to self reported low milk supply. Mom reported (+) breast changes during this pregnancy, she mentioned that her breasts have always been large but noticed some growth on the nipple/areola area, no darkening though. LC revised hand expression with mom and able to get only very small droplets of colostrum, LC rubbed it on baby's mouth. She has a DEBP at home.  Baby having his exam by NP Lauren when entering the room, offered assistance with latch and mom agreed to wake baby up to feed. LC took baby STS to mom's right breast but baby was really sleepy. He'd open his mouth but would not suck. Baby would do a few sucks on a gloved finger though and then he went back to sleep. Asked mom to call for assistance when needed, an attempt was documented in Flowsheets. Reviewed normal newborn behavior, cluster feeding and feeding cues.  Feeding plan:  1. Encouraged mom to feed baby STS 8-12 times/24 hours or sooner if feeding cues are present 2. Hand expression and spoon feeding were also encouraged  BF brochure, BF resources and feeding diary were reviewed. Parents reported all questions and concerns were answered, they're both aware of LC services and will call PRN.  Maternal Data Formula Feeding for Exclusion: No Has patient been taught Hand Expression?: Yes Does the patient have breastfeeding experience prior to this delivery?: Yes  Feeding Feeding Type: Breast Fed  LATCH Score Latch: Grasps breast easily, tongue down, lips flanged, rhythmical sucking.  Audible Swallowing: A few with stimulation  Type of Nipple: Everted at rest  and after stimulation  Comfort (Breast/Nipple): Soft / non-tender  Hold (Positioning): No assistance needed to correctly position infant at breast.  LATCH Score: 9  Interventions Interventions: Breast feeding basics reviewed;Assisted with latch;Skin to skin;Breast massage;Hand express;Breast compression;Adjust position;Support pillows  Lactation Tools Discussed/Used WIC Program: No   Consult Status Consult Status: Follow-up Date: 09/26/18 Follow-up type: In-patient    Brenda Banks 09/25/2018, 8:23 PM

## 2018-09-26 LAB — CBC
HCT: 28.4 % — ABNORMAL LOW (ref 36.0–46.0)
Hemoglobin: 9.2 g/dL — ABNORMAL LOW (ref 12.0–15.0)
MCH: 28.5 pg (ref 26.0–34.0)
MCHC: 32.4 g/dL (ref 30.0–36.0)
MCV: 87.9 fL (ref 80.0–100.0)
Platelets: 175 10*3/uL (ref 150–400)
RBC: 3.23 MIL/uL — ABNORMAL LOW (ref 3.87–5.11)
RDW: 13.7 % (ref 11.5–15.5)
WBC: 9.4 10*3/uL (ref 4.0–10.5)
nRBC: 0 % (ref 0.0–0.2)

## 2018-09-26 MED ORDER — IBUPROFEN 600 MG PO TABS: 600.0000 mg | ORAL_TABLET | Freq: Four times a day (QID) | ORAL | 0 refills | Status: AC

## 2018-09-26 NOTE — Progress Notes (Signed)
MOB was referred for history of depression/anxiety. * Referral screened out by Clinical Social Worker because none of the following criteria appear to apply: ~ History of anxiety/depression during this pregnancy, or of post-partum depression following prior delivery. ~ Diagnosis of anxiety and/or depression within last 3 years OR * MOB's symptoms currently being treated with medication and/or therapy. MOB has an active Rx for Zoloft.   Please contact the Clinical Social Worker if needs arise, by MOB request, or if MOB scores greater than 9/yes to question 10 on [place] Postpartum Depression Screen.  Samy Ryner Boyd-Gilyard, MSW, LCSW Clinical Social Work [phone]  

## 2018-09-26 NOTE — Lactation Note (Signed)
This note was copied from a baby's chart. Lactation Consultation Note  Patient Name: Boy Kylee Ernzen [facility] Date: 09/26/2018 Reason for consult: Follow-up assessment;Infant weight loss;Term  65 hours old FT female who is being fully BF by his mother she's a P2 and experienced BF. Mom and baby are going home today, mom requested an early discharge. Baby asleep doing STS with mom when entering the room, he had his circumcision this morning and was still very sleepy, not ready to feed. But mom stated that BF is going well when baby is awake and does latch on, he's just been sleepy. Baby is at 2% weight loss.  Reviewed discharge instructions and red flags on when to call baby's pediatrician. Also, advised mom to start pumping every 3 hours at home in case she baby continues to be sleepy and to offer any amount of EBM, reviewed volumes required at 24, 48 and 72 hours of life. Both parents understand that mother's milk will always be our first choice when supplementation is needed. Baby already has a pediatrician appt tomorrow.   Discussed engorgement prevention and treatment, as well as prevention/treatment for sore nipples. Parents reported all questions and concerns were answered, they're both aware of LC OP services and will contact PRN.  Maternal Data    Feeding      Interventions Interventions: Breast feeding basics reviewed  Lactation Tools Discussed/Used     Consult Status Consult Status: Complete Date: 09/26/18 Follow-up type: Call as needed    Katha Kuehne Venetia Constable 09/26/2018, 11:43 AM

## 2018-09-26 NOTE — Anesthesia Postprocedure Evaluation (Signed)
Anesthesia Post Note  Patient: Brenda Banks  Procedure(s) Performed: AN AD HOC LABOR EPIDURAL     Patient location during evaluation: Mother Baby Anesthesia Type: Epidural Level of consciousness: awake and alert Pain management: pain level controlled Vital Signs Assessment: post-procedure vital signs reviewed and stable Respiratory status: spontaneous breathing, nonlabored ventilation and respiratory function stable Cardiovascular status: stable Postop Assessment: no headache, no backache and epidural receding Anesthetic complications: no    Last Vitals:  Vitals:   09/26/18 0100 09/26/18 0528  BP: 115/73 106/66  Pulse: 89 89  Resp: 18 18  Temp: (!) 36.4 C 36.5 C  SpO2: 100% 100%    Last Pain:  Vitals:   09/26/18 0528  TempSrc: Oral  PainSc:    Pain Goal:                   Adalberto Metzgar

## 2018-09-26 NOTE — Discharge Summary (Signed)
OB Discharge Summary     Patient Name: Brenda Banks DOB: [birth year] MRN: [id number]  Date of admission: 09/25/2018 Delivering MD: Myna Hidalgo   Date of discharge: 09/26/2018  Admitting diagnosis: pregnancy Intrauterine pregnancy: [redacted]w[redacted]d     Secondary diagnosis:  Active Problems:   Labor and delivery indication for care or intervention  Additional problems: Anxiety     Discharge diagnosis: Term Pregnancy Delivered                                                                                                Post partum procedures:none  Augmentation: Pitocin and Cytotec  Complications: None  Hospital course:  Induction of Labor With Vaginal Delivery   33 y.o. yo [id number] at [redacted]w[redacted]d was admitted to the hospital 09/25/2018 for induction of labor.  Indication for induction: elective.  Patient had an uncomplicated labor course as follows: Membrane Rupture Time/Date: 9:10 AM ,09/25/2018   Intrapartum Procedures: Episiotomy: None [1]                                         Lacerations:  2nd degree [3]  Patient had delivery of a Viable infant.  Information for the patient's newborn:  Brenda, Banks [[id number]]      09/25/2018  Details of delivery can be found in separate delivery note.  Patient had a routine postpartum course. Patient is discharged home 09/26/18.  Physical exam  Vitals:   09/25/18 1738 09/25/18 2121 09/26/18 0100 09/26/18 0528  BP: 126/80 112/66 115/73 106/66  Pulse: (!) 107 (!) 102 89 89  Resp: 18 16 18 18   Temp: 98.7 F (37.1 C) 98.6 F (37 C) (!) 97.5 F (36.4 C) 97.7 F (36.5 C)  TempSrc: Oral Oral Oral Oral  SpO2:  100% 100% 100%  Weight:      Height:       General: alert, cooperative and no distress Lochia: appropriate Uterine Fundus: firm Incision: N/A DVT Evaluation: No evidence of DVT seen on physical exam. Labs: Lab Results  Component Value Date   WBC 9.4 09/26/2018   HGB 9.2 (L) 09/26/2018   HCT 28.4 (L) 09/26/2018   MCV 87.9  09/26/2018   PLT 175 09/26/2018   No flowsheet data found.  Discharge instruction: per After Visit Summary and "Baby and Me Booklet".  After visit meds:  Allergies as of 09/26/2018      Reactions   Cefdinir    As per pt   Sulfa Antibiotics       Medication List    TAKE these medications   benzocaine 20 % rectal ointment Commonly known as:  AMERICAINE Place rectally every 3 (three) hours as needed for pain (severe rectal pain).   erythromycin ophthalmic ointment Apply 1/2 inch ribbon to affected eye QID for 5 days   hydrocortisone 2.5 % rectal cream Commonly known as:  Anusol-HC Place 1 application rectally 2 (two) times daily. As needed for hemorrhoid pain, up to 1 week   ibuprofen 600 MG tablet Commonly  known as:  ADVIL Take 1 tablet (600 mg total) by mouth every 6 (six) hours.   sertraline 100 MG tablet Commonly known as:  ZOLOFT Take 1 tablet (100 mg total) by mouth daily.       Diet: routine diet  Activity: Advance as tolerated. Pelvic rest for 6 weeks.   Outpatient follow up:6 weeks Follow up Appt:No future appointments. Follow up Visit:No follow-ups on file.  Postpartum contraception: Progesterone only pills  Newborn Data: Live born female  Birth Weight: 8 lb 13.5 oz (4010 g) APGAR: 6, 9  Newborn Delivery   Birth date/time:  09/25/2018 14:22:00 Delivery type:  Vaginal, Spontaneous     Baby Feeding: Breast Disposition:home with mother   09/26/2018 Brenda SellerJennifer M Herve Haug, DO

## 2019-04-22 IMAGING — US US OB < 14 WEEKS - US OB TV
1 series · 15 of 28 positions shown · non-contrast
Comparison: None.

CLINICAL DATA: Vaginal bleeding

EXAM:
OBSTETRIC <14 WK US AND TRANSVAGINAL OB US
TECHNIQUE: Both transabdominal and transvaginal ultrasound examinations were
performed for complete evaluation of the gestation as well as the
maternal uterus, adnexal regions, and pelvic cul-de-sac.
Transvaginal technique was performed to assess early pregnancy.

[Series 1: us ob < 14 weeks - us ob tv · 15 of 44 slices shown]
[im 1/44]
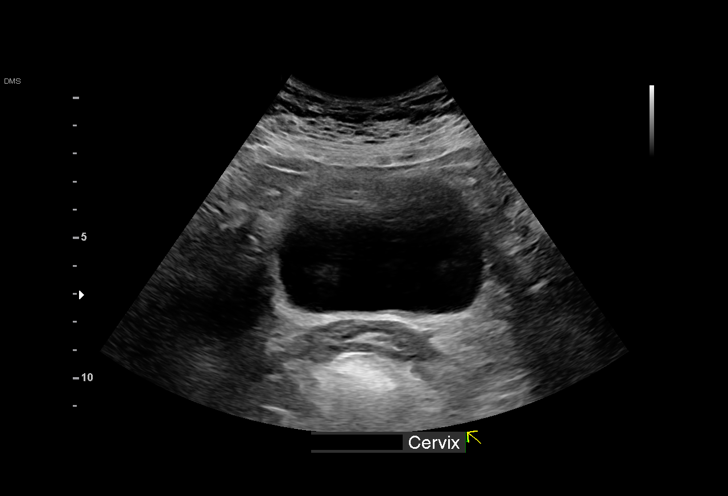
[im 4/44]
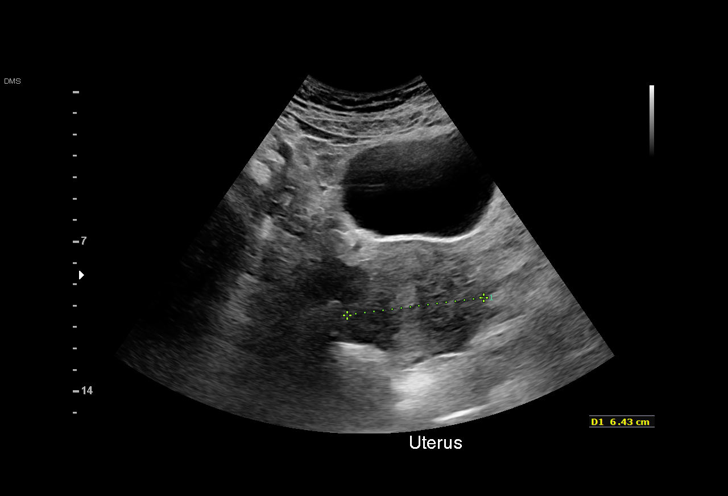
[im 7/44]
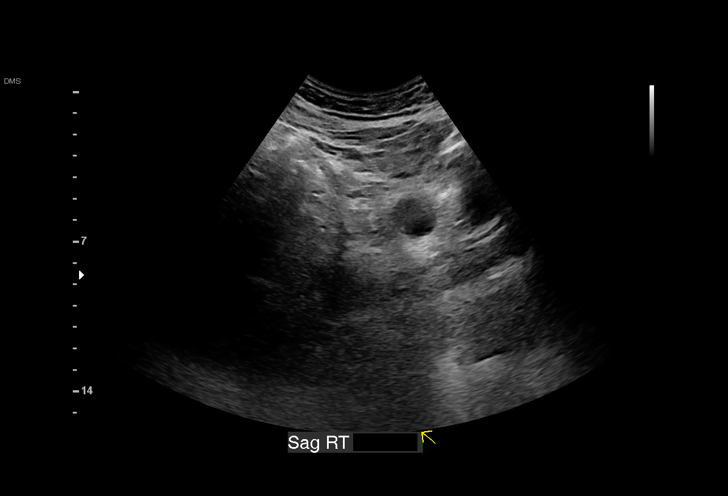
[im 10/44]
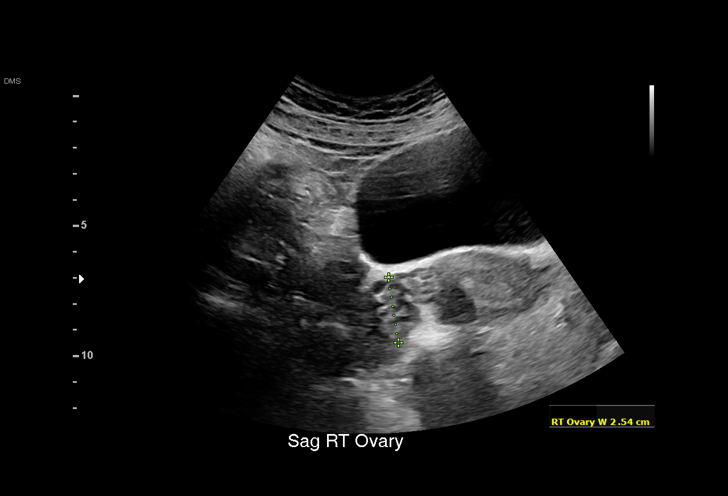
[im 13/44]
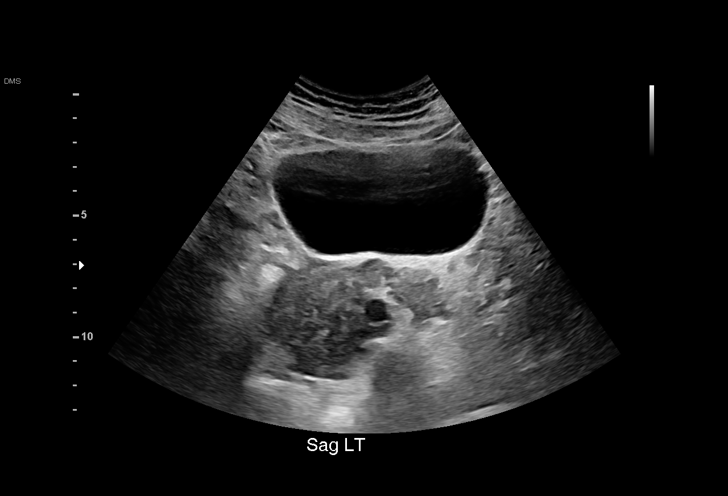
[im 16/44]
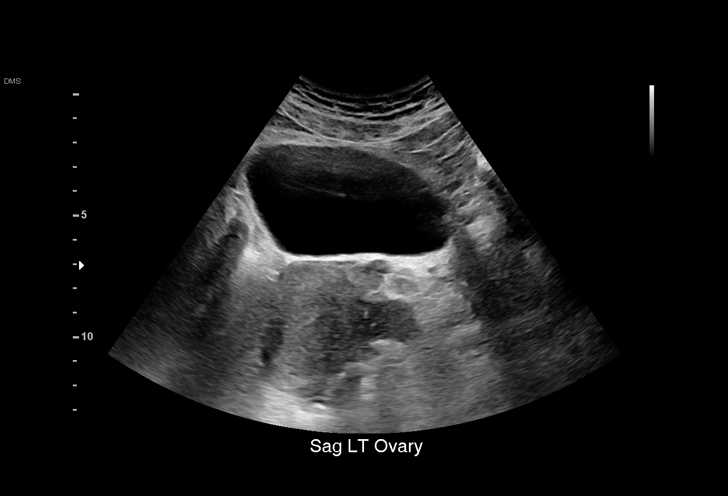
[im 20/44]
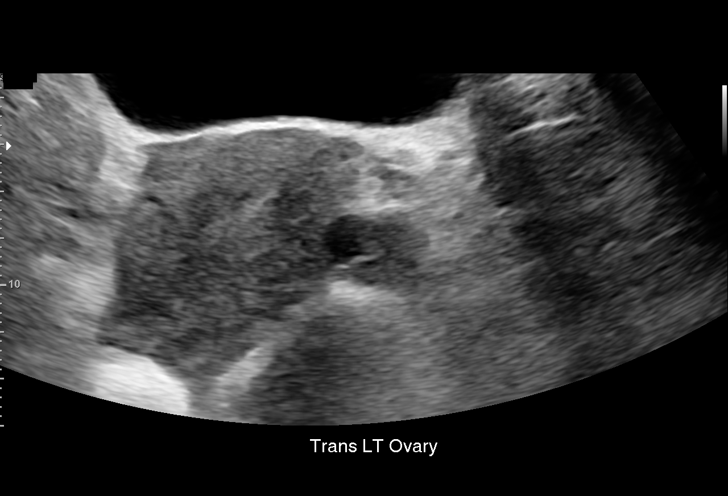
[im 23/44]
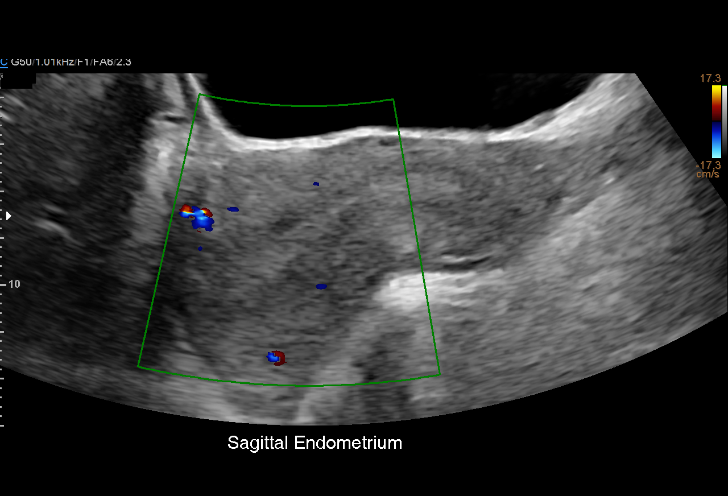
[im 24/44]
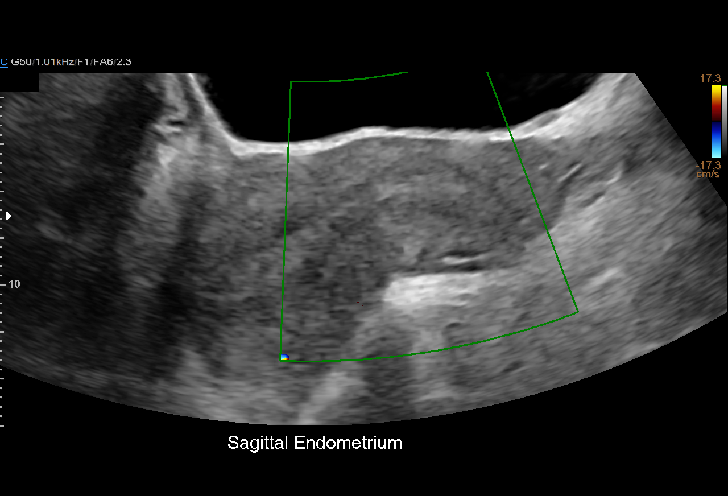
[im 28/44]
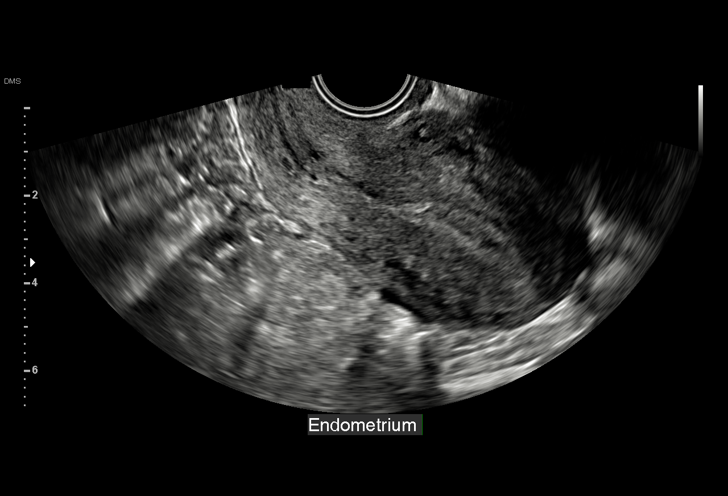
[im 31/44]
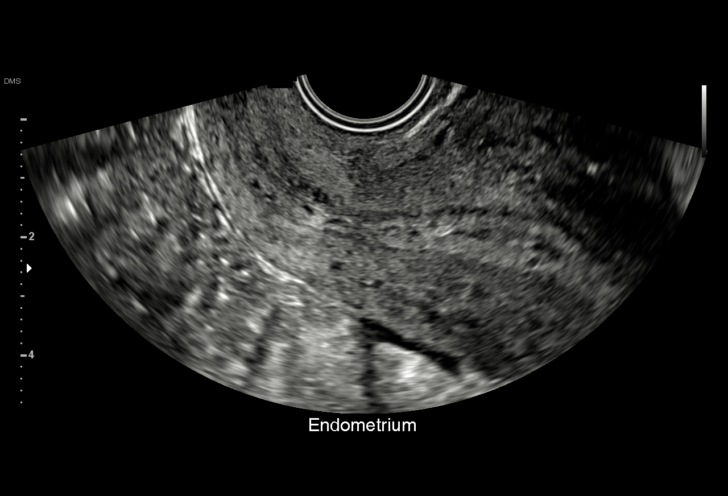
[im 34/44]
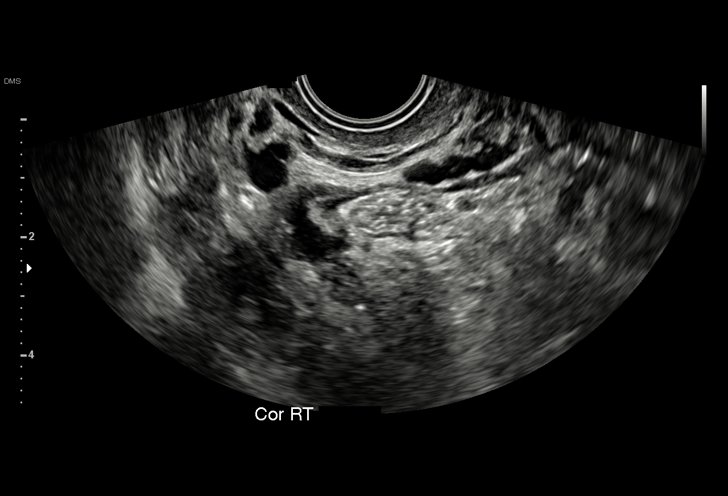
[im 37/44]
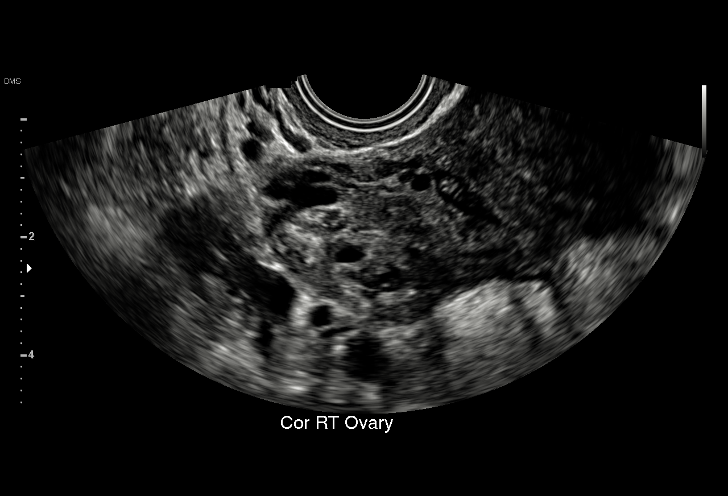
[im 40/44]
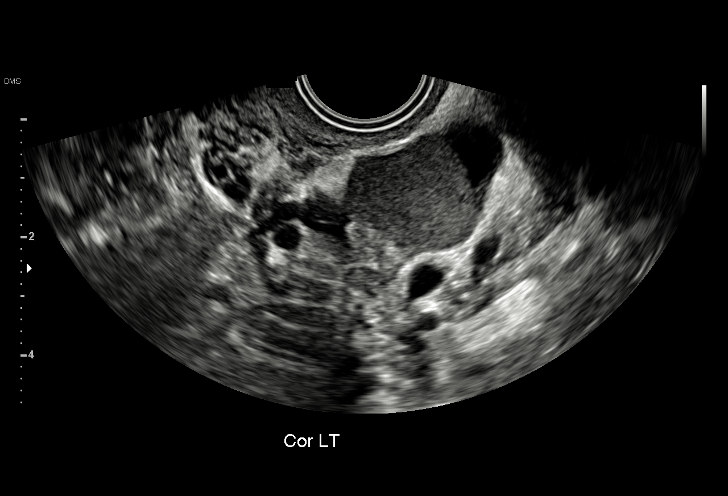
[im 44/44]
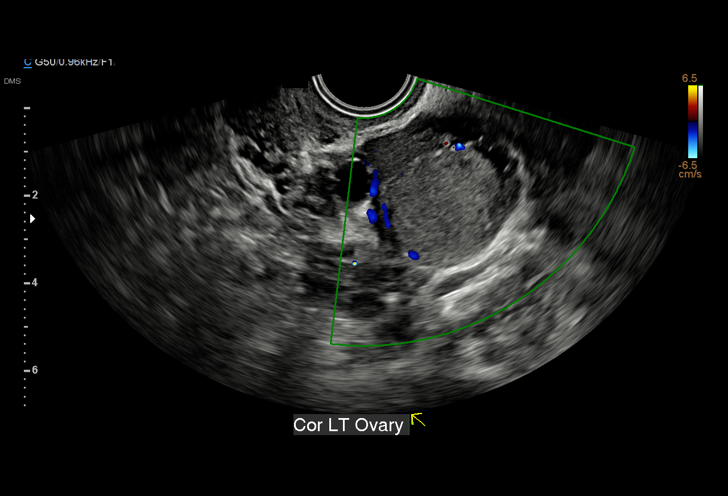

[15 of 28 positions shown; findings below may reference images not displayed]

FINDINGS: Intrauterine gestational sac: None

Yolk sac:  Not visualized

Embryo:  Not visualized

Cardiac Activity: Not visualized

Heart Rate:   bpm

MSD:   mm    w     d

CRL:    mm    w    d                  US EDC:

Subchorionic hemorrhage:  None visualized.

Maternal uterus/adnexae: The uterus is retroverted. No adnexal mass.
Trace free fluid.
IMPRESSION: No intrauterine pregnancy visualized. Differential considerations
would include early intrauterine pregnancy too early to visualize,
spontaneous abortion, or occult ectopic pregnancy. Recommend close
clinical followup and serial quantitative beta HCGs and ultrasounds.

## 2020-07-24 ENCOUNTER — Encounter: Payer: Self-pay | Admitting: Osteopathic Medicine

## 2020-07-24 NOTE — Progress Notes (Signed)
Atypical Squamous Cells of Undetermined Significance
